# Patient Record
Sex: Male | Born: 1965 | Marital: Married | Smoking: Never smoker
Health system: Southern US, Community
[De-identification: ages and names within clinical notes are randomized; demographics above are authoritative.]

## PROBLEM LIST (undated history)

## (undated) DIAGNOSIS — A048 Other specified bacterial intestinal infections: Secondary | ICD-10-CM

## (undated) DIAGNOSIS — D473 Essential (hemorrhagic) thrombocythemia: Secondary | ICD-10-CM

## (undated) DIAGNOSIS — K7460 Unspecified cirrhosis of liver: Secondary | ICD-10-CM

## (undated) DIAGNOSIS — K7400 Hepatic fibrosis, unspecified: Secondary | ICD-10-CM

## (undated) DIAGNOSIS — B1910 Unspecified viral hepatitis B without hepatic coma: Secondary | ICD-10-CM

## (undated) DIAGNOSIS — D75839 Thrombocytosis, unspecified: Secondary | ICD-10-CM

## (undated) HISTORY — DX: Hepatic fibrosis: K74.0

## (undated) HISTORY — DX: Unspecified cirrhosis of liver: K74.60

## (undated) HISTORY — DX: Thrombocytosis, unspecified: D75.839

## (undated) HISTORY — DX: Other specified bacterial intestinal infections: A04.8

## (undated) HISTORY — DX: Unspecified viral hepatitis B without hepatic coma: B19.10

## (undated) HISTORY — DX: Hepatic fibrosis, unspecified: K74.00

## (undated) HISTORY — PX: NO PAST SURGERIES: SHX2092

## (undated) HISTORY — DX: Essential (hemorrhagic) thrombocythemia: D47.3

---

## 2006-06-08 ENCOUNTER — Ambulatory Visit: Payer: Self-pay | Admitting: Hematology & Oncology

## 2006-07-03 LAB — CBC WITH DIFFERENTIAL/PLATELET
Basophils Absolute: 0 10*3/uL (ref 0.0–0.1)
EOS%: 1.1 % (ref 0.0–7.0)
Eosinophils Absolute: 0.1 10*3/uL (ref 0.0–0.5)
HCT: 44.2 % (ref 38.7–49.9)
HGB: 14.4 g/dL (ref 13.0–17.1)
MCH: 25.5 pg — ABNORMAL LOW (ref 28.0–33.4)
MONO#: 0.5 10*3/uL (ref 0.1–0.9)
NEUT#: 4 10*3/uL (ref 1.5–6.5)
NEUT%: 57.9 % (ref 40.0–75.0)
RDW: 13 % (ref 11.2–14.6)
lymph#: 2.3 10*3/uL (ref 0.9–3.3)

## 2006-07-10 LAB — SPEP & IFE WITH QIG
Alpha-2-Globulin: 8.8 % (ref 7.1–11.8)
Beta 2: 6.1 % (ref 3.2–6.5)
Beta Globulin: 5.5 % (ref 4.7–7.2)
IgA: 256 mg/dL (ref 68–378)
IgG (Immunoglobin G), Serum: 2960 mg/dL — ABNORMAL HIGH (ref 694–1618)
Total Protein, Serum Electrophoresis: 8.7 g/dL — ABNORMAL HIGH (ref 6.0–8.3)

## 2006-07-10 LAB — COMPREHENSIVE METABOLIC PANEL
Albumin: 4.3 g/dL (ref 3.5–5.2)
BUN: 17 mg/dL (ref 6–23)
CO2: 26 mEq/L (ref 19–32)
Calcium: 9.2 mg/dL (ref 8.4–10.5)
Chloride: 102 mEq/L (ref 96–112)
Glucose, Bld: 83 mg/dL (ref 70–99)
Potassium: 4 mEq/L (ref 3.5–5.3)
Sodium: 142 mEq/L (ref 135–145)
Total Protein: 8.7 g/dL — ABNORMAL HIGH (ref 6.0–8.3)

## 2006-07-10 LAB — BETA 2 MICROGLOBULIN, SERUM: Beta-2 Microglobulin: 2.24 mg/L — ABNORMAL HIGH (ref 1.01–1.73)

## 2006-10-26 ENCOUNTER — Ambulatory Visit: Payer: Self-pay | Admitting: Hematology & Oncology

## 2006-10-30 LAB — CBC WITH DIFFERENTIAL/PLATELET
Basophils Absolute: 0 10*3/uL (ref 0.0–0.1)
EOS%: 0.8 % (ref 0.0–7.0)
Eosinophils Absolute: 0.1 10*3/uL (ref 0.0–0.5)
LYMPH%: 33.9 % (ref 14.0–48.0)
MCH: 25.8 pg — ABNORMAL LOW (ref 28.0–33.4)
MCV: 79.1 fL — ABNORMAL LOW (ref 81.6–98.0)
MONO%: 7.4 % (ref 0.0–13.0)
NEUT#: 4.1 10*3/uL (ref 1.5–6.5)
Platelets: 139 10*3/uL — ABNORMAL LOW (ref 145–400)
RBC: 5.39 10*6/uL (ref 4.20–5.71)
RDW: 13.5 % (ref 11.2–14.6)

## 2006-11-01 LAB — COMPREHENSIVE METABOLIC PANEL
ALT: 34 U/L (ref 0–53)
AST: 60 U/L — ABNORMAL HIGH (ref 0–37)
Albumin: 3.9 g/dL (ref 3.5–5.2)
Alkaline Phosphatase: 111 U/L (ref 39–117)
BUN: 17 mg/dL (ref 6–23)
CO2: 23 mEq/L (ref 19–32)
Calcium: 9.1 mg/dL (ref 8.4–10.5)
Chloride: 103 mEq/L (ref 96–112)
Creatinine, Ser: 1.15 mg/dL (ref 0.40–1.50)
Glucose, Bld: 116 mg/dL — ABNORMAL HIGH (ref 70–99)
Potassium: 4 mEq/L (ref 3.5–5.3)
Sodium: 137 mEq/L (ref 135–145)
Total Bilirubin: 0.7 mg/dL (ref 0.3–1.2)
Total Protein: 8.8 g/dL — ABNORMAL HIGH (ref 6.0–8.3)

## 2006-11-01 LAB — IGG, IGA, IGM
IgA: 210 mg/dL (ref 68–378)
IgG (Immunoglobin G), Serum: 3480 mg/dL — ABNORMAL HIGH (ref 694–1618)
IgM, Serum: 78 mg/dL (ref 60–263)

## 2006-11-01 LAB — PROTEIN ELECTROPHORESIS, SERUM
Albumin ELP: 43.2 % — ABNORMAL LOW (ref 55.8–66.1)
Alpha-1-Globulin: 3.3 % (ref 2.9–4.9)
Alpha-2-Globulin: 7.4 % (ref 7.1–11.8)
Beta 2: 5.9 % (ref 3.2–6.5)
Beta Globulin: 5.2 % (ref 4.7–7.2)
Gamma Globulin: 35 % — ABNORMAL HIGH (ref 11.1–18.8)
Total Protein, Serum Electrophoresis: 8.8 g/dL — ABNORMAL HIGH (ref 6.0–8.3)

## 2010-04-20 ENCOUNTER — Encounter: Admission: RE | Admit: 2010-04-20 | Discharge: 2010-04-20 | Payer: Self-pay | Admitting: Family Medicine

## 2011-05-07 ENCOUNTER — Ambulatory Visit (INDEPENDENT_AMBULATORY_CARE_PROVIDER_SITE_OTHER): Payer: 59

## 2011-05-07 DIAGNOSIS — Z0000 Encounter for general adult medical examination without abnormal findings: Secondary | ICD-10-CM

## 2011-05-07 DIAGNOSIS — Z23 Encounter for immunization: Secondary | ICD-10-CM

## 2011-05-07 DIAGNOSIS — F524 Premature ejaculation: Secondary | ICD-10-CM

## 2011-06-27 ENCOUNTER — Ambulatory Visit (INDEPENDENT_AMBULATORY_CARE_PROVIDER_SITE_OTHER): Payer: 59

## 2011-06-27 DIAGNOSIS — M79609 Pain in unspecified limb: Secondary | ICD-10-CM

## 2011-06-27 DIAGNOSIS — M25559 Pain in unspecified hip: Secondary | ICD-10-CM

## 2011-07-02 ENCOUNTER — Ambulatory Visit (INDEPENDENT_AMBULATORY_CARE_PROVIDER_SITE_OTHER): Payer: 59 | Admitting: Family Medicine

## 2011-07-02 DIAGNOSIS — IMO0001 Reserved for inherently not codable concepts without codable children: Secondary | ICD-10-CM

## 2011-07-02 DIAGNOSIS — M62838 Other muscle spasm: Secondary | ICD-10-CM

## 2011-07-02 DIAGNOSIS — M79606 Pain in leg, unspecified: Secondary | ICD-10-CM

## 2011-07-02 MED ORDER — METHOCARBAMOL 750 MG PO TABS: ORAL_TABLET | ORAL | Status: DC

## 2011-07-02 NOTE — Progress Notes (Signed)
  Subjective:    Patient ID: Arthur Wright, male    DOB: [birth year], 46 y.o.   MRN: [id number]  HPI patient is here for a recheck of that thigh pain. It continues to hurt quite a lot from the right lower lateral hip area down to the knee, just down the quadriceps area. Once again we do not know of any specific injury. However if the patient walks for a while it starts just spasming up. 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 He is scheduled to work some kind of a more stationary job tomorrow, then has Monday and Tuesday off. He thinks he might be able to get light duty at work is ordered.   Review of Systems no related issue     Objective:   Physical Exam pain distribution as described above he is not tender in his low back has good range of motion of the back he is a little tender in the right lateral hip and down his anterior thigh. He has been sitting here relaxing and it is not as tight right now since he has not been walking a distance. There is no radiation of symptoms below the knee.        Assessment & Plan:  Muscular pain right thigh, and overuse cramping  We will try a muscle relaxer, light duty, and a physical therapy referral. He is to let me know about Thursday or Friday how he is doing, and if absolutely no better, back in so I can reassess and arrange for a referral to an orthopedist or sports medicine specialist.

## 2011-07-02 NOTE — Patient Instructions (Signed)
Was advised to take the muscle relaxants and we will put him on light duty at work. In addition we will make referral to a physical therapist.

## 2012-11-10 ENCOUNTER — Ambulatory Visit (INDEPENDENT_AMBULATORY_CARE_PROVIDER_SITE_OTHER): Payer: 59 | Admitting: Family Medicine

## 2012-11-10 VITALS — BP 125/77 | HR 65 | Temp 97.7°F | Resp 16 | Ht 73.5 in | Wt 313.0 lb

## 2012-11-10 DIAGNOSIS — Z789 Other specified health status: Secondary | ICD-10-CM

## 2012-11-10 DIAGNOSIS — Z0000 Encounter for general adult medical examination without abnormal findings: Secondary | ICD-10-CM

## 2012-11-10 LAB — POCT CBC
Lymph, poc: 2.8 (ref 0.6–3.4)
MCH, POC: 25.3 pg — AB (ref 27–31.2)
MCHC: 30 g/dL — AB (ref 31.8–35.4)
MCV: 84.3 fL (ref 80–97)
MID (cbc): 0.5 (ref 0–0.9)
POC LYMPH PERCENT: 41.2 %L (ref 10–50)
Platelet Count, POC: 148 10*3/uL (ref 142–424)
RBC: 5.37 M/uL (ref 4.69–6.13)
RDW, POC: 13.3 %
WBC: 6.9 10*3/uL (ref 4.6–10.2)

## 2012-11-10 LAB — COMPREHENSIVE METABOLIC PANEL
Albumin: 4.2 g/dL (ref 3.5–5.2)
Alkaline Phosphatase: 84 U/L (ref 39–117)
BUN: 14 mg/dL (ref 6–23)
Creat: 1.07 mg/dL (ref 0.50–1.35)
Glucose, Bld: 84 mg/dL (ref 70–99)
Potassium: 4.4 mEq/L (ref 3.5–5.3)

## 2012-11-10 LAB — LIPID PANEL
HDL: 47 mg/dL (ref >39)
LDL Cholesterol: 80 mg/dL (ref 0–99)
Triglycerides: 74 mg/dL (ref <150)

## 2012-11-10 MED ORDER — ONDANSETRON 4 MG PO TBDP: ORAL_TABLET | ORAL | Status: DC

## 2012-11-10 MED ORDER — DOXYCYCLINE HYCLATE 100 MG PO TABS: ORAL_TABLET | ORAL | Status: DC

## 2012-11-10 MED ORDER — CIPROFLOXACIN HCL 500 MG PO TABS: ORAL_TABLET | ORAL | Status: DC

## 2012-11-10 NOTE — Progress Notes (Signed)
Annual physical examination:  History: 47 year old man here for physical examination. He is getting ready to go to [place] to visit his mother. He wants malaria prophylaxis. The other major concerns.  Past medical history: General he is been healthy. No major operations. No major medical illnesses. No surgeries. Not be allergic to any medications. Not on any regular medications.  Family history: Father is deceased from bone cancer. Mother is living in [place] still. No major familial disease.  Social history: Married, works doing Naval architect work Chief Executive Officer. Does not smoke, occasionally drinks, substances. Has 4 children from about 3-17. Attends [employer].  Review of systems: Constitutional: Unremarkable Respiratory: Unremarkable Cardiovascular: Unremarkable HEENT: Unremarkable GI: Unremarkable GU: Unremarkable Musculoskeletal: Unremarkable Dermatologic: Unremarkable Hematologic: Unremarkable Neurologic: Unremarkable Psychiatric: Unremarkable Endocrinologic: Unremarkable   Physical examination: Well-developed well-nourished moderately overweight African man in no acute distress. His TMs are normal. Eyes PERRLA. Fundi benign. Throat clear. Missing one upper tooth of the right. Neck supple without nodes thyromegaly. No carotid bruits. Chest clear to auscultation. Heart regular without murmurs gallops or arrhythmias. And soft without mass or tenderness. Normal male external genitalia. Throat is unremarkable. Skin unremarkable. He has a number of old scars.  Assessment: Normal physical examination Child medicine for upcoming trip to [place]  Plan: Doxycycline, Cipro, Zofran

## 2012-11-10 NOTE — Patient Instructions (Addendum)
Take the doxycycline one daily beginning the day before UA enter [place] and continuing for one month after return  In the event of traveler's diarrhea, take the ciprofloxacin one twice daily for 3 or 4 days  Use the Zofran (ondansetron) orally dissolvable tablets as needed for nausea and vomiting  Go on the Internet to [employer].gov and look up travel to [place] for other guidelines and precautions  Drink clean water

## 2013-04-04 ENCOUNTER — Other Ambulatory Visit: Payer: Self-pay

## 2014-03-14 ENCOUNTER — Other Ambulatory Visit: Payer: Self-pay

## 2014-11-22 ENCOUNTER — Ambulatory Visit (INDEPENDENT_AMBULATORY_CARE_PROVIDER_SITE_OTHER): Payer: 59 | Admitting: Family Medicine

## 2014-11-22 ENCOUNTER — Other Ambulatory Visit: Payer: Self-pay | Admitting: Family Medicine

## 2014-11-22 VITALS — BP 140/80 | HR 67 | Temp 98.2°F | Resp 18 | Ht 73.0 in | Wt 312.8 lb

## 2014-11-22 DIAGNOSIS — Z789 Other specified health status: Secondary | ICD-10-CM | POA: Diagnosis not present

## 2014-11-22 DIAGNOSIS — B181 Chronic viral hepatitis B without delta-agent: Secondary | ICD-10-CM | POA: Insufficient documentation

## 2014-11-22 DIAGNOSIS — B1910 Unspecified viral hepatitis B without hepatic coma: Secondary | ICD-10-CM

## 2014-11-22 DIAGNOSIS — Z0000 Encounter for general adult medical examination without abnormal findings: Secondary | ICD-10-CM

## 2014-11-22 DIAGNOSIS — Z8619 Personal history of other infectious and parasitic diseases: Secondary | ICD-10-CM | POA: Diagnosis not present

## 2014-11-22 DIAGNOSIS — E669 Obesity, unspecified: Secondary | ICD-10-CM | POA: Diagnosis not present

## 2014-11-22 DIAGNOSIS — B161 Acute hepatitis B with delta-agent without hepatic coma: Secondary | ICD-10-CM | POA: Diagnosis not present

## 2014-11-22 LAB — POCT URINALYSIS DIPSTICK
Bilirubin, UA: NEGATIVE
Blood, UA: NEGATIVE
Glucose, UA: NEGATIVE
Ketones, UA: NEGATIVE
Leukocytes, UA: NEGATIVE
Nitrite, UA: NEGATIVE
Spec Grav, UA: 1.02
Urobilinogen, UA: 1
pH, UA: 7

## 2014-11-22 LAB — POCT CBC
Granulocyte percent: 51.9 % (ref 37–80)
HCT, POC: 46.1 % (ref 43.5–53.7)
Hemoglobin: 13.9 g/dL — AB (ref 14.1–18.1)
Lymph, poc: 2.3 (ref 0.6–3.4)
MCH, POC: 24.2 pg — AB (ref 27–31.2)
MCHC: 30.1 g/dL — AB (ref 31.8–35.4)
MCV: 80.2 fL (ref 80–97)
MID (cbc): 0.6 (ref 0–0.9)
MPV: 9.1 fL (ref 0–99.8)
POC Granulocyte: 3.2 (ref 2–6.9)
POC LYMPH PERCENT: 38.5 % (ref 10–50)
POC MID %: 9.6 % (ref 0–12)
Platelet Count, POC: 137 K/uL — AB (ref 142–424)
RBC: 5.75 M/uL (ref 4.69–6.13)
RDW, POC: 14.1 %
WBC: 6.1 K/uL (ref 4.6–10.2)

## 2014-11-22 LAB — LIPID PANEL
Cholesterol: 144 mg/dL (ref 0–200)
HDL: 47 mg/dL
LDL Cholesterol: 80 mg/dL (ref 0–99)
Total CHOL/HDL Ratio: 3.1 ratio
Triglycerides: 84 mg/dL
VLDL: 17 mg/dL (ref 0–40)

## 2014-11-22 MED ORDER — DOXYCYCLINE HYCLATE 100 MG PO CAPS: 100.0000 mg | ORAL_CAPSULE | Freq: Every day | ORAL | Status: DC

## 2014-11-22 NOTE — Progress Notes (Signed)
Patient ID: Arthur Wright, male   DOB: 1965-08-29, 49 y.o.   MRN: [id number]   This chart was scribed for Arthur Haber, MD by [facility], medical scribe at [facility].The patient was seen in exam room 09 and the patient's care was started at 10:34 AM.  Patient ID: Arthur Wright MRN: [id number], DOB: [birth year], 49 y.o. Date of Encounter: 11/22/2014  Primary Physician: No primary care provider on file.  Chief Complaint:  Chief Complaint  Patient presents with   Annual Exam   HPI:  Arthur Wright is a 49 y.o. male who presents to [facility] here for an annual physical exam.   Married, 4 kids. He does not take any medications no surgeries. Family history of liver cancer. Both brothers and his father passed away due to liver cancer. He did have hepatitis B. He will be going to [place] on Monday and will be there for 45 days, he needs a prescription for doxycycline. He does some exercise, mostly ab workouts.  He is originally from [place]. 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  No past medical history on file.   Home Meds: Prior to Admission medications   Medication Sig Start Date End Date Taking? Authorizing Provider  ciprofloxacin (CIPRO) 500 MG tablet Take one twice daily beginning immediately after onset of diarrhea, continuing for 3 or 4 days. Patient not taking: Reported on 11/22/2014 11/10/12   Posey Boyer, MD  doxycycline (VIBRA-TABS) 100 MG tablet Take one daily starting the days you are going to [place] and continuing until 30 days after return Patient not taking: Reported on 11/22/2014 11/10/12   Posey Boyer, MD  doxycycline (VIBRAMYCIN) 100 MG capsule Take 100 mg by mouth daily.    Historical Provider, MD  methocarbamol (ROBAXIN) 750 MG tablet Use 2 tablets 3 time daily Patient not taking: Reported on 11/22/2014 07/02/11   Posey Boyer, MD  nabumetone (RELAFEN) 750 MG tablet Take 750 mg by mouth 2 (two) times daily as needed.     Historical Provider, MD  ondansetron (ZOFRAN ODT) 4 MG disintegrating tablet Dissolve one in mouth every 6 hours as needed for nausea and vomiting Patient not taking: Reported on 11/22/2014 11/10/12   Posey Boyer, MD  predniSONE (DELTASONE) 20 MG tablet Take 20 mg by mouth daily. As directed    Historical Provider, MD    Allergies: No Known Allergies  History   Social History   Marital Status: Single    Spouse Name: N/A   Number of Children: N/A   Years of Education: N/A   Occupational History   Not on file.   Social History Main Topics   Smoking status: Never Smoker    Smokeless tobacco: Not on file   Alcohol Use: 0.5 oz/week    1 drink(s) per week     Comment: every 2-4 weeks; occasionally   Drug Use: No   Sexual Activity: Not on file   Other Topics Concern   Not on file   Social History Narrative    Review of Systems: Constitutional: negative for chills, fever, night sweats, weight changes, or fatigue  HEENT: negative for vision changes, hearing loss, congestion, rhinorrhea, ST, epistaxis, or sinus pressure Cardiovascular: negative for chest pain or palpitations Respiratory: negative for hemoptysis, wheezing, shortness of breath, or cough Abdominal: negative for abdominal pain, nausea, vomiting, diarrhea, or constipation Dermatological: negative for rash Neurologic: negative for headache, dizziness, or syncope All other systems reviewed and  are otherwise negative with the exception to those above and in the HPI.  Physical Exam: Blood pressure 140/80, pulse 67, temperature 98.2 F (36.8 C), temperature source Oral, resp. rate 18, height 6\' 1"  (1.854 m), weight 312 lb 12.8 oz (141.885 kg), SpO2 97 %., Body mass index is 41.28 kg/(m^2). General: Well developed, well nourished, in no acute distress. Head: Normocephalic, atraumatic, eyes without discharge, sclera non-icteric, nares are without discharge. Bilateral auditory canals clear, TM's are without  perforation, pearly grey and translucent with reflective cone of light bilaterally. Oral cavity moist, posterior pharynx without exudate, erythema, peritonsillar abscess, or post nasal drip.  Neck: Supple. No thyromegaly. Full ROM. No lymphadenopathy. Lungs: Clear bilaterally to auscultation without wheezes, rales, or rhonchi. Breathing is unlabored. Heart: RRR with S1 S2. No murmurs, rubs, or gallops appreciated. Abdomen: Soft, non-tender, non-distended with normoactive bowel sounds. No hepatomegaly. No rebound/guarding. No obvious abdominal masses. Msk:  Strength and tone normal for age. Extremities/Skin: Warm and dry. No clubbing or cyanosis. No edema. No rashes or suspicious lesions. Neuro: Alert and oriented X 3. Moves all extremities spontaneously. Gait is normal. CNII-XII grossly in tact. Psych:  Responds to questions appropriately with a normal affect.   Labs: Results for orders placed or performed in visit on 11/10/12  Comprehensive metabolic panel  Result Value Ref Range   Sodium 134 (L) 135 - 145 mEq/L   Potassium 4.4 3.5 - 5.3 mEq/L   Chloride 102 96 - 112 mEq/L   CO2 28 19 - 32 mEq/L   Glucose, Bld 84 70 - 99 mg/dL   BUN 14 6 - 23 mg/dL   Creat 1.07 0.50 - 1.35 mg/dL   Total Bilirubin 0.5 0.3 - 1.2 mg/dL   Alkaline Phosphatase 84 39 - 117 U/L   AST 48 (H) 0 - 37 U/L   ALT 23 0 - 53 U/L   Total Protein 9.0 (H) 6.0 - 8.3 g/dL   Albumin 4.2 3.5 - 5.2 g/dL   Calcium 9.5 8.4 - 10.5 mg/dL  Lipid panel  Result Value Ref Range   Cholesterol 142 0 - 200 mg/dL   Triglycerides 74 <150 mg/dL   HDL 47 >39 mg/dL   Total CHOL/HDL Ratio 3.0 Ratio   VLDL 15 0 - 40 mg/dL   LDL Cholesterol 80 0 - 99 mg/dL  POCT CBC  Result Value Ref Range   WBC 6.9 4.6 - 10.2 K/uL   Lymph, poc 2.8 0.6 - 3.4   POC LYMPH PERCENT 41.2 10 - 50 %L   MID (cbc) 0.5 0 - 0.9   POC MID % 7.2 0 - 12 %M   POC Granulocyte 3.6 2 - 6.9   Granulocyte percent 51.6 37 - 80 %G   RBC 5.37 4.69 - 6.13 M/uL    Hemoglobin 13.6 (A) 14.1 - 18.1 g/dL   HCT, POC 45.3 43.5 - 53.7 %   MCV 84.3 80 - 97 fL   MCH, POC 25.3 (A) 27 - 31.2 pg   MCHC 30.0 (A) 31.8 - 35.4 g/dL   RDW, POC 13.3 %   Platelet Count, POC 148 142 - 424 K/uL   MPV 9.9 0 - 99.8 fL  POCT glycosylated hemoglobin (Hb A1C)  Result Value Ref Range   Hemoglobin A1C 5.0      ASSESSMENT AND PLAN:  49 y.o. year old male with annual exam  This chart was scribed in my presence and reviewed by me personally.    ICD-9-CM ICD-10-CM   1.  Annual physical exam V70.0 Z00.00 POCT CBC     POCT urinalysis dipstick     Lipid panel     COMPLETE METABOLIC PANEL WITH GFR  2. Viral hepatitis B with hepatitis delta, without hepatic coma, unspecified chronicity 070.31 B16.1 Hepatitis B surface antibody     Hepatitis B surface antigen     COMPLETE METABOLIC PANEL WITH GFR  3. History of foreign travel V49.89 Z39.9      Signed, Arthur Haber, MD  Signed, Arthur Haber, MD 11/22/2014 10:34 AM

## 2014-11-22 NOTE — Patient Instructions (Signed)

## 2014-11-23 LAB — HEPATITIS B SURFACE ANTIBODY, QUANTITATIVE: Hepatitis B-Post: 1 m[IU]/mL

## 2014-11-23 LAB — HEPATITIS B SURF AG CONFIRMATION: Hepatitis B Surf Ag Confirmation: POSITIVE — AB

## 2014-11-23 LAB — HEPATITIS B SURFACE ANTIGEN: Hepatitis B Surface Ag: POSITIVE — AB

## 2014-11-26 ENCOUNTER — Other Ambulatory Visit: Payer: Self-pay | Admitting: Family Medicine

## 2014-11-26 DIAGNOSIS — B1910 Unspecified viral hepatitis B without hepatic coma: Secondary | ICD-10-CM

## 2014-11-26 LAB — COMPREHENSIVE METABOLIC PANEL
ALT: 20 U/L (ref 0–53)
AST: 49 U/L — ABNORMAL HIGH (ref 0–37)
Albumin: 3.9 g/dL (ref 3.5–5.2)
Alkaline Phosphatase: 92 U/L (ref 39–117)
BUN: 12 mg/dL (ref 6–23)
CO2: 24 mEq/L (ref 19–32)
Calcium: 9.4 mg/dL (ref 8.4–10.5)
Chloride: 101 mEq/L (ref 96–112)
Creat: 1.05 mg/dL (ref 0.50–1.35)
Glucose, Bld: 79 mg/dL (ref 70–99)
Potassium: 4.2 mEq/L (ref 3.5–5.3)
Sodium: 138 mEq/L (ref 135–145)
Total Bilirubin: 0.4 mg/dL (ref 0.2–1.2)
Total Protein: 9.7 g/dL — ABNORMAL HIGH (ref 6.0–8.3)

## 2014-11-27 ENCOUNTER — Encounter: Payer: Self-pay | Admitting: Family Medicine

## 2014-11-27 NOTE — Progress Notes (Signed)
lmom to give [place] a call back plus i sent a copy of his labs

## 2014-12-05 ENCOUNTER — Telehealth: Payer: Self-pay

## 2014-12-05 NOTE — Telephone Encounter (Signed)
This patient phone is not working and the cell phone isn't ringing its off with no voice mail set up i thought this patient went to [place] maybe that's why his phone is off Doctor

## 2014-12-05 NOTE — Telephone Encounter (Signed)
-----   Message from Robyn Haber, MD sent at 11/27/2014  6:34 AM EDT ----- Let patient know that he has been referred to gastroenterology for the active hepatitis B.  (He may be out of the country in [place] the month of July) ----- Message -----    From: SYSTEM    Sent: 11/27/2014  12:04 AM      To: Robyn Haber, MD

## 2014-12-06 ENCOUNTER — Encounter: Payer: Self-pay | Admitting: Family Medicine

## 2014-12-12 NOTE — Telephone Encounter (Signed)
Pt called back about labs. Notified. He is out of town and will call for referral when he gets back

## 2015-01-28 ENCOUNTER — Telehealth: Payer: Self-pay

## 2015-01-28 DIAGNOSIS — B169 Acute hepatitis B without delta-agent and without hepatic coma: Secondary | ICD-10-CM

## 2015-01-28 NOTE — Telephone Encounter (Signed)
New referral placed.

## 2015-01-28 NOTE — Telephone Encounter (Signed)
Patient called and asked for a referral to a liver specialist.  We originally placed a referral at [facility], but the referrals coordinator there sent a message in that referral:  General 11/28/2014 10:21 AM SHIVES, DUSTIN T        Note   7/1 left message for call back - Needs to go to [place].        Please submit new referral for patient.  Thank you.

## 2015-01-29 ENCOUNTER — Telehealth: Payer: Self-pay

## 2015-01-29 NOTE — Telephone Encounter (Signed)
Patient called for referrals with  A phone number for [facility] care [phone]

## 2015-03-03 ENCOUNTER — Other Ambulatory Visit (HOSPITAL_COMMUNITY): Payer: Self-pay | Admitting: Nurse Practitioner

## 2015-03-03 DIAGNOSIS — B181 Chronic viral hepatitis B without delta-agent: Secondary | ICD-10-CM

## 2015-03-17 ENCOUNTER — Ambulatory Visit (HOSPITAL_COMMUNITY)
Admission: RE | Admit: 2015-03-17 | Discharge: 2015-03-17 | Disposition: A | Discharge status: Home / Self Care | Payer: 59 | Source: Ambulatory Visit | Attending: Nurse Practitioner | Admitting: Nurse Practitioner

## 2015-03-17 DIAGNOSIS — B181 Chronic viral hepatitis B without delta-agent: Secondary | ICD-10-CM | POA: Diagnosis not present

## 2015-06-09 ENCOUNTER — Other Ambulatory Visit: Payer: Self-pay | Admitting: Nurse Practitioner

## 2015-06-09 DIAGNOSIS — B181 Chronic viral hepatitis B without delta-agent: Secondary | ICD-10-CM

## 2015-09-08 ENCOUNTER — Ambulatory Visit
Admission: RE | Admit: 2015-09-08 | Discharge: 2015-09-08 | Disposition: A | Discharge status: Home / Self Care | Payer: 59 | Source: Ambulatory Visit | Attending: Nurse Practitioner | Admitting: Nurse Practitioner

## 2015-09-08 DIAGNOSIS — B181 Chronic viral hepatitis B without delta-agent: Secondary | ICD-10-CM

## 2015-12-22 ENCOUNTER — Ambulatory Visit (INDEPENDENT_AMBULATORY_CARE_PROVIDER_SITE_OTHER): Payer: 59 | Admitting: Urgent Care

## 2015-12-22 VITALS — BP 120/90 | HR 85 | Temp 98.2°F | Resp 16 | Ht 73.0 in | Wt 309.8 lb

## 2015-12-22 DIAGNOSIS — R21 Rash and other nonspecific skin eruption: Secondary | ICD-10-CM | POA: Diagnosis not present

## 2015-12-22 DIAGNOSIS — B169 Acute hepatitis B without delta-agent and without hepatic coma: Secondary | ICD-10-CM

## 2015-12-22 DIAGNOSIS — B1910 Unspecified viral hepatitis B without hepatic coma: Secondary | ICD-10-CM

## 2015-12-22 DIAGNOSIS — Z0000 Encounter for general adult medical examination without abnormal findings: Secondary | ICD-10-CM | POA: Diagnosis not present

## 2015-12-22 DIAGNOSIS — Z23 Encounter for immunization: Secondary | ICD-10-CM | POA: Diagnosis not present

## 2015-12-22 LAB — COMPREHENSIVE METABOLIC PANEL
ALBUMIN: 4 g/dL (ref 3.6–5.1)
ALK PHOS: 93 U/L (ref 40–115)
ALT: 33 U/L (ref 9–46)
AST: 69 U/L — AB (ref 10–40)
BILIRUBIN TOTAL: 0.5 mg/dL (ref 0.2–1.2)
BUN: 12 mg/dL (ref 7–25)
CHLORIDE: 102 mmol/L (ref 98–110)
CO2: 25 mmol/L (ref 20–31)
CREATININE: 1.04 mg/dL (ref 0.60–1.35)
Calcium: 9 mg/dL (ref 8.6–10.3)
Glucose, Bld: 87 mg/dL (ref 65–99)
Potassium: 4.3 mmol/L (ref 3.5–5.3)
SODIUM: 135 mmol/L (ref 135–146)
TOTAL PROTEIN: 9.2 g/dL — AB (ref 6.1–8.1)

## 2015-12-22 LAB — CBC
HCT: 43.5 % (ref 38.5–50.0)
HEMOGLOBIN: 13.8 g/dL (ref 13.2–17.1)
MCH: 25 pg — AB (ref 27.0–33.0)
MCHC: 31.7 g/dL — ABNORMAL LOW (ref 32.0–36.0)
MCV: 78.7 fL — ABNORMAL LOW (ref 80.0–100.0)
Platelets: 145 10*3/uL (ref 140–400)
RBC: 5.53 MIL/uL (ref 4.20–5.80)
RDW: 13.5 % (ref 11.0–15.0)
WBC: 5.3 10*3/uL (ref 3.8–10.8)

## 2015-12-22 LAB — TSH: TSH: 0.92 m[IU]/L (ref 0.40–4.50)

## 2015-12-22 LAB — LIPID PANEL
Cholesterol: 136 mg/dL (ref 125–200)
HDL: 52 mg/dL (ref >=40)
LDL CALC: 68 mg/dL (ref <130)
Total CHOL/HDL Ratio: 2.6 Ratio (ref <=5.0)
Triglycerides: 79 mg/dL (ref <150)
VLDL: 16 mg/dL (ref <30)

## 2015-12-22 LAB — POCT SKIN KOH: Skin KOH, POC: POSITIVE

## 2015-12-22 MED ORDER — KETOCONAZOLE 2 % EX CREA: 1.0000 "application " | TOPICAL_CREAM | Freq: Every day | CUTANEOUS | 0 refills | Status: AC

## 2015-12-22 NOTE — Progress Notes (Signed)
MRN: [id number]  Subjective:   [name] is a 50 y.o. male presenting for annual physical exam.  Medical care team includes: PCP: Dr. Linna Darner who is now retired, Jaynee Eagles, PA-C will no be PCP. Vision: Wears glasses, has yearly eye exams. Dental: Gets cleanings yearly. Specialists: Managed by a Nurse Practitioner for viral hepatitis. Has follow up scheduled in Sept 2017. RUQ U/S was reassuring in 08/2015.    Patient is married, has 4 children, good relationships at home. Works as a Biochemist, clinical. Declines STI testing. Denies smoking cigarettes or drinking alcohol.   Arthur Wright has H/O type B viral hepatitis and Obesity on his problem list.  Arthur Wright currently has no medications in their medication list. He has No Known Allergies.  Arthur Wright  has a past medical history of Hepatitis B. Also denies past surgeries.  His family history includes Cancer in his father. Father passed from bone cancer, also had diabetes. Brother also had liver cancer, passed from this.  Immunizations: Needs TDAP updated.  Review of Systems  Constitutional: Negative for chills, diaphoresis, fever, malaise/fatigue and weight loss.  HENT: Negative for congestion, ear discharge, ear pain, hearing loss, nosebleeds, sore throat and tinnitus.   Eyes: Negative for blurred vision, double vision, photophobia, pain, discharge and redness.  Respiratory: Negative for cough, shortness of breath and wheezing.   Cardiovascular: Negative for chest pain, palpitations and leg swelling.  Gastrointestinal: Negative for abdominal pain, blood in stool, constipation, diarrhea, nausea and vomiting.  Genitourinary: Negative for dysuria, flank pain, frequency, hematuria and urgency.  Musculoskeletal: Negative for back pain, joint pain and myalgias.  Skin: Positive for rash (right scalp). Negative for itching.  Neurological: Negative for dizziness, tingling, seizures, loss of consciousness, weakness and headaches.    Endo/Heme/Allergies: Negative for polydipsia.  Psychiatric/Behavioral: Negative for depression, hallucinations, memory loss, substance abuse and suicidal ideas. The patient is not nervous/anxious and does not have insomnia.    Objective:   Vitals: BP 120/90   Pulse 85   Temp 98.2 F (36.8 C) (Oral)   Resp 16   Ht 6\' 1"  (1.854 m)   Wt (!) 309 lb 12.8 oz (140.5 kg)   SpO2 97%   BMI 40.87 kg/m    Visual Acuity Screening   Right eye Left eye Both eyes  Without correction:     With correction: 20/15 20/15 20/15     Physical Exam  Constitutional: He is oriented to person, place, and time. He appears well-developed and well-nourished.  HENT:  TM's intact bilaterally, no effusions or erythema. Nasal turbinates pink and moist, nasal passages patent. No sinus tenderness. Oropharynx clear, mucous membranes moist, dentition in good repair.  Eyes: Conjunctivae and EOM are normal. Pupils are equal, round, and reactive to light. Right eye exhibits no discharge. Left eye exhibits no discharge. No scleral icterus.  Neck: Normal range of motion. Neck supple. No thyromegaly present.  Cardiovascular: Normal rate, regular rhythm and intact distal pulses.  Exam reveals no gallop and no friction rub.   No murmur heard. Pulmonary/Chest: No stridor. No respiratory distress. He has no wheezes. He has no rales.  Abdominal: Soft. Bowel sounds are normal. He exhibits no distension and no mass. There is no tenderness.  Musculoskeletal: Normal range of motion. He exhibits no edema or tenderness.  Lymphadenopathy:    He has no cervical adenopathy.  Neurological: He is alert and oriented to person, place, and time. He has normal reflexes.  Skin: Skin is warm and dry. Rash (3 annular scaly  pruritic lesions over right temporal scalp and left upper lateral thigh all about ~1.5cm in size) noted. No erythema. No pallor.  Psychiatric: He has a normal mood and affect.   Results for orders placed or performed in  visit on 12/22/15 (from the past 24 hour(s))  POCT Skin KOH     Status: None   Collection Time: 12/22/15 12:32 PM  Result Value Ref Range   Skin KOH, POC Positive     Assessment and Plan :   1. Annual physical exam - Medically stable, labs pending. - Discussed healthy lifestyle, diet, exercise, preventative care, vaccinations, and addressed patient's concerns.    2. Hepatitis B virus infection, unspecified chronicity - Hepatitis B surface antigen pending, continue f/u with GI.  3. Rash and nonspecific skin eruption - Start ketoconazole for 2 weeks. Call if no improvement.  4. Need for Tdap vaccination - Tdap vaccine greater than or equal to 7yo IM   Jaynee Eagles, PA-C [facility] [phone] 12/22/2015  11:48 AM

## 2015-12-22 NOTE — Patient Instructions (Addendum)

## 2015-12-23 LAB — HEPATITIS B SURFACE ANTIGEN: HEP B S AG: POSITIVE — AB

## 2015-12-23 LAB — HEPATITIS B SURF AG CONFIRMATION: Hepatitis B Surf Ag Confirmation: POSITIVE — AB

## 2016-03-08 ENCOUNTER — Other Ambulatory Visit (HOSPITAL_COMMUNITY): Payer: Self-pay | Admitting: Nurse Practitioner

## 2016-03-08 DIAGNOSIS — B181 Chronic viral hepatitis B without delta-agent: Secondary | ICD-10-CM

## 2016-03-08 DIAGNOSIS — R748 Abnormal levels of other serum enzymes: Secondary | ICD-10-CM

## 2016-03-09 ENCOUNTER — Other Ambulatory Visit: Payer: Self-pay | Admitting: Nurse Practitioner

## 2016-03-09 DIAGNOSIS — B181 Chronic viral hepatitis B without delta-agent: Secondary | ICD-10-CM

## 2016-03-21 ENCOUNTER — Ambulatory Visit
Admission: RE | Admit: 2016-03-21 | Discharge: 2016-03-21 | Disposition: A | Discharge status: Home / Self Care | Payer: 59 | Source: Ambulatory Visit | Attending: Nurse Practitioner | Admitting: Nurse Practitioner

## 2016-03-21 DIAGNOSIS — B181 Chronic viral hepatitis B without delta-agent: Secondary | ICD-10-CM

## 2016-03-28 ENCOUNTER — Other Ambulatory Visit: Payer: Self-pay | Admitting: Radiology

## 2016-03-29 ENCOUNTER — Encounter (HOSPITAL_COMMUNITY): Payer: Self-pay

## 2016-03-29 ENCOUNTER — Ambulatory Visit (HOSPITAL_COMMUNITY)
Admission: RE | Admit: 2016-03-29 | Discharge: 2016-03-29 | Disposition: A | Discharge status: Home / Self Care | Payer: 59 | Source: Ambulatory Visit | Attending: Nurse Practitioner | Admitting: Nurse Practitioner

## 2016-03-29 DIAGNOSIS — R945 Abnormal results of liver function studies: Secondary | ICD-10-CM | POA: Diagnosis present

## 2016-03-29 DIAGNOSIS — B1910 Unspecified viral hepatitis B without hepatic coma: Secondary | ICD-10-CM | POA: Insufficient documentation

## 2016-03-29 DIAGNOSIS — K740 Hepatic fibrosis: Secondary | ICD-10-CM | POA: Diagnosis not present

## 2016-03-29 DIAGNOSIS — R748 Abnormal levels of other serum enzymes: Secondary | ICD-10-CM

## 2016-03-29 DIAGNOSIS — Z800 Family history of malignant neoplasm of digestive organs: Secondary | ICD-10-CM | POA: Insufficient documentation

## 2016-03-29 DIAGNOSIS — Z808 Family history of malignant neoplasm of other organs or systems: Secondary | ICD-10-CM | POA: Diagnosis not present

## 2016-03-29 DIAGNOSIS — B181 Chronic viral hepatitis B without delta-agent: Secondary | ICD-10-CM

## 2016-03-29 LAB — PROTIME-INR
INR: 1.07
PROTHROMBIN TIME: 13.9 s (ref 11.4–15.2)

## 2016-03-29 LAB — CBC
HEMATOCRIT: 43.7 % (ref 39.0–52.0)
Hemoglobin: 14 g/dL (ref 13.0–17.0)
MCH: 25.3 pg — AB (ref 26.0–34.0)
MCHC: 32 g/dL (ref 30.0–36.0)
MCV: 78.9 fL (ref 78.0–100.0)
Platelets: 151 10*3/uL (ref 150–400)
RBC: 5.54 MIL/uL (ref 4.22–5.81)
RDW: 13.3 % (ref 11.5–15.5)
WBC: 5.6 10*3/uL (ref 4.0–10.5)

## 2016-03-29 LAB — APTT: APTT: 37 s — AB (ref 24–36)

## 2016-03-29 MED ORDER — MIDAZOLAM HCL 2 MG/2ML IJ SOLN
INTRAMUSCULAR | Status: AC | PRN
  Administered 2016-03-29: 2 mg via INTRAVENOUS

## 2016-03-29 MED ORDER — GELATIN ABSORBABLE 12-7 MM EX MISC
CUTANEOUS | Status: AC
  Filled 2016-03-29: qty 1

## 2016-03-29 MED ORDER — FENTANYL CITRATE (PF) 100 MCG/2ML IJ SOLN
INTRAMUSCULAR | Status: AC | PRN
  Administered 2016-03-29 (×2): 50 ug via INTRAVENOUS

## 2016-03-29 MED ORDER — LIDOCAINE HCL 1 % IJ SOLN
INTRAMUSCULAR | Status: AC
  Filled 2016-03-29: qty 20

## 2016-03-29 MED ORDER — SODIUM CHLORIDE 0.9 % IV SOLN
INTRAVENOUS | Status: AC | PRN
  Administered 2016-03-29: 30 mL/h via INTRAVENOUS

## 2016-03-29 MED ORDER — SODIUM CHLORIDE 0.9 % IV SOLN: INTRAVENOUS | Status: DC

## 2016-03-29 MED ORDER — MIDAZOLAM HCL 2 MG/2ML IJ SOLN
INTRAMUSCULAR | Status: AC
  Filled 2016-03-29: qty 4

## 2016-03-29 MED ORDER — FENTANYL CITRATE (PF) 100 MCG/2ML IJ SOLN
INTRAMUSCULAR | Status: AC
  Filled 2016-03-29: qty 4

## 2016-03-29 NOTE — Discharge Instructions (Signed)

## 2016-03-29 NOTE — Sedation Documentation (Signed)
Patient is resting comfortably. 

## 2016-03-29 NOTE — H&P (Signed)
Chief Complaint: Patient was seen in consultation today for liver core biopsy at the request of Drazek,Dawn  Referring Physician(s): Drazek,Dawn  Supervising Physician: Aletta Edouard  Patient Status: [facility] - Out-pt  History of Present Illness: Arthur Wright is a 50 y.o. male   Known Hep B Follows with [facility] NP Noted new elevation in liver functions Now scheduled for random liver core biopsy  [place] 03/21/16: IMPRESSION: Similar findings suggestive of mild cirrhotic change. No discrete hepatic lesions. No ascites. Further evaluation with abdominal MRI could be performed as indicated  Past Medical History:  Diagnosis Date  . Hepatitis B     History reviewed. No pertinent surgical history.  Allergies: Review of patient's allergies indicates no known allergies.  Medications: Prior to Admission medications   Not on File     Family History  Problem Relation Age of Onset  . Cancer Father     Social History   Social History  . Marital status: Single    Spouse name: N/A  . Number of children: N/A  . Years of education: N/A   Social History Main Topics  . Smoking status: Never Smoker  . Smokeless tobacco: None  . Alcohol use 0.5 oz/week    1 drink(s) per week     Comment: every 2-4 weeks; occasionally  . Drug use: No  . Sexual activity: Not Asked   Other Topics Concern  . None   Social History Narrative  . None     Review of Systems: A 12 point ROS discussed and pertinent positives are indicated in the HPI above.  All other systems are negative.  Review of Systems  Constitutional: Negative for activity change, appetite change and fatigue.  Respiratory: Negative for shortness of breath.   Cardiovascular: Negative for chest pain.  Gastrointestinal: Negative for abdominal pain.  Neurological: Negative for weakness.  Psychiatric/Behavioral: Negative for behavioral problems and confusion.    Vital Signs: BP (!) 142/85   Pulse 62   Temp  97.9 F (36.6 C) (Oral)   Resp 20   Ht 6' 1.5" (1.867 m)   Wt (!) 309 lb (140.2 kg)   SpO2 98%   BMI 40.21 kg/m   Physical Exam  Constitutional: He is oriented to person, place, and time. He appears well-nourished.  Cardiovascular: Normal rate, regular rhythm and normal heart sounds.   Pulmonary/Chest: Effort normal and breath sounds normal. He has no wheezes.  Abdominal: Soft. Bowel sounds are normal. There is no tenderness.  Musculoskeletal: Normal range of motion.  Neurological: He is alert and oriented to person, place, and time.  Skin: Skin is warm and dry.  Psychiatric: He has a normal mood and affect. His behavior is normal. Judgment and thought content normal.  Nursing note and vitals reviewed.   Mallampati Score:  MD Evaluation Airway: WNL Heart: WNL Abdomen: WNL Chest/ Lungs: WNL ASA  Classification: 2 Mallampati/Airway Score: Two  Imaging: US Abdomen Limited Ruq  Result Date: 03/21/2016 CLINICAL DATA:  Chronic hepatitis-C. EXAM: US ABDOMEN LIMITED - RIGHT UPPER QUADRANT COMPARISON:  Right upper quadrant abdominal ultrasound - 09/08/2015; CT abdomen and pelvis - 04/20/2010 FINDINGS: Gallbladder: The gallbladder is normal in appearance. No gallbladder wall thickening or pericholecystic fluid. No radiopaque gallstones or sludge. Negative sonographic Murphy's sign. Common bile duct: Diameter: Normal in size measuring 4 mm in diameter Liver: Re- demonstrated diffuse slightly coarsened echogenicity of the hepatic parenchyma with apparent mild nodularity of the hepatic or (representative image 12). No discrete hepatic lesions. No  intrahepatic biliary duct dilatation. No ascites. IMPRESSION: Similar findings suggestive of mild cirrhotic change. No discrete hepatic lesions. No ascites. Further evaluation with abdominal MRI could be performed as indicated. Electronically Signed   By: Sandi Mariscal M.D.   On: 03/21/2016 09:10    Labs:  CBC:  Recent Labs  12/22/15 1203  03/29/16 1133  WBC 5.3 5.6  HGB 13.8 14.0  HCT 43.5 43.7  PLT 145 151    COAGS:  Recent Labs  03/29/16 1133  INR 1.07  APTT 37*    BMP:  Recent Labs  12/22/15 1203  NA 135  K 4.3  CL 102  CO2 25  GLUCOSE 87  BUN 12  CALCIUM 9.0  CREATININE 1.04    LIVER FUNCTION TESTS:  Recent Labs  12/22/15 1203  BILITOT 0.5  AST 69*  ALT 33  ALKPHOS 93  PROT 9.2*  ALBUMIN 4.0    TUMOR MARKERS: No results for input(s): AFPTM, CEA, CA199, CHROMGRNA in the last 8760 hours.  Assessment and Plan:  Known Hep B New elevated LFTs Now for liver core biopsy Risks and Benefits discussed with the patient including, but not limited to bleeding, infection, damage to adjacent structures or low yield requiring additional tests. All of the patient's questions were answered, patient is agreeable to proceed. Consent signed and in chart.   Thank you for this interesting consult.  I greatly enjoyed meeting Arthur Wright and look forward to participating in their care.  A copy of this report was sent to the requesting provider on this date.  Electronically Signed: Ayonna Speranza A 03/29/2016, 12:17 PM   I spent a total of  30 Minutes   in face to face in clinical consultation, greater than 50% of which was counseling/coordinating care for liver core bx

## 2016-03-29 NOTE — Procedures (Signed)
Interventional Radiology Procedure Note  Procedure:  US guided core biopsy of liver  Complications: None  Estimated Blood Loss: < 10 mL  18 G core biopsy x 2 via 17 G needle.  Gelfoam pledgets advanced on completion.  Venetia Night. Kathlene Cote, M.D Pager:  [phone]

## 2016-03-29 NOTE — Sedation Documentation (Signed)
o2 2l/[place] started

## 2016-03-29 NOTE — Sedation Documentation (Signed)
Family updated as to patient's status.  Family at side awaiting transporters to [facility]

## 2016-05-12 ENCOUNTER — Encounter: Payer: Self-pay | Admitting: Physician Assistant

## 2016-09-14 ENCOUNTER — Other Ambulatory Visit: Payer: Self-pay | Admitting: Nurse Practitioner

## 2016-09-14 DIAGNOSIS — I10 Essential (primary) hypertension: Secondary | ICD-10-CM

## 2016-09-14 DIAGNOSIS — K7469 Other cirrhosis of liver: Secondary | ICD-10-CM

## 2016-09-15 ENCOUNTER — Encounter: Payer: Self-pay | Admitting: Gastroenterology

## 2016-09-26 ENCOUNTER — Ambulatory Visit
Admission: RE | Admit: 2016-09-26 | Discharge: 2016-09-26 | Disposition: A | Discharge status: Home / Self Care | Payer: 59 | Source: Ambulatory Visit | Attending: Nurse Practitioner | Admitting: Nurse Practitioner

## 2016-09-26 DIAGNOSIS — K7460 Unspecified cirrhosis of liver: Secondary | ICD-10-CM | POA: Diagnosis not present

## 2016-09-26 DIAGNOSIS — I10 Essential (primary) hypertension: Secondary | ICD-10-CM

## 2016-09-26 DIAGNOSIS — K7469 Other cirrhosis of liver: Secondary | ICD-10-CM

## 2016-10-18 ENCOUNTER — Encounter: Payer: Self-pay | Admitting: Gastroenterology

## 2016-10-18 ENCOUNTER — Ambulatory Visit (INDEPENDENT_AMBULATORY_CARE_PROVIDER_SITE_OTHER): Payer: 59 | Admitting: Gastroenterology

## 2016-10-18 VITALS — BP 112/78 | HR 74 | Ht 74.0 in | Wt 307.6 lb

## 2016-10-18 DIAGNOSIS — K7469 Other cirrhosis of liver: Secondary | ICD-10-CM | POA: Diagnosis not present

## 2016-10-18 DIAGNOSIS — B181 Chronic viral hepatitis B without delta-agent: Secondary | ICD-10-CM | POA: Diagnosis not present

## 2016-10-18 NOTE — Progress Notes (Signed)
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 He has been on antiviral therapy since earlier this year and his last known platelet count is 150,000 with a normal INR. He has not had any previous upper GI bleeding, encephalopathy, or problems with volume overload. He denies passing black or bloody stool, hematemesis, abdominal pain or altered bowel habits. He works full time driving a Forensic scientist.    ROS:  Review of Systems  Constitutional: Negative for appetite change and unexpected weight change.  HENT: Negative for mouth sores and voice change.   Eyes: Negative for pain and redness.  Respiratory: Negative for cough and shortness of breath.   Cardiovascular: Negative for chest pain and palpitations.  Genitourinary: Negative for dysuria and hematuria.  Musculoskeletal: Negative for arthralgias and myalgias.  Skin: Negative for pallor and rash.  Neurological: Negative for weakness and headaches.  Hematological: Negative for adenopathy.     Past Medical History: Past Medical History:  Diagnosis Date  . Cirrhosis ([place])   . Hepatitis B   . Liver fibrosis   . Thrombocythemia [facility])      Past Surgical History: History reviewed. No pertinent surgical history.  No prior abdominal surgery   Family History: Family History  Problem Relation Age of Onset  . Hepatitis B Father   . Hypertension Father   . Hepatitis B Brother   . Liver cancer Brother     Social History: Social History   Social History  . Marital status: Single    Spouse name: N/A  . Number of children: N/A  . Years of education: N/A     Social History Main Topics  . Smoking status: Never Smoker  . Smokeless tobacco: Never Used  . Alcohol use 0.5 oz/week    1 Standard drinks or equivalent per week     Comment: every 2-4 weeks; occasionally  . Drug use: No  . Sexual activity: Not Asked   Other Topics Concern  . None   Social History Narrative  . None   He is originally from [place] in [place] and has lived in the [place] 16 years.  Allergies: No Known Allergies  Outpatient Meds: Current Outpatient Prescriptions  Medication Sig Dispense Refill  . entecavir (BARACLUDE) 0.5 MG tablet Take 0.5 mg by mouth daily.     No current facility-administered medications for this visit.       ___________________________________________________________________ Objective   Exam:  BP 112/78   Pulse 74   Ht 6\' 2"  (1.88 m)   Wt (!) 307 lb 9.6 oz (139.5 kg)   BMI 39.49 kg/m    General: this is a(n)Obese and otherwise well-appearing man  Eyes: sclera anicteric, no redness  ENT: oral mucosa moist without lesions, no cervical or supraclavicular lymphadenopathy, good dentition  CV: RRR without murmur, S1/S2, no JVD, no peripheral edema  Resp: clear to auscultation bilaterally, normal RR and effort noted  GI: soft,  No tenderness, with active bowel sounds. No guarding or palpable organomegaly noted.  Skin; warm and dry, no rash or jaundice noted  Neuro: awake, alert and oriented x 3. Normal gross motor function and fluent speech  Labs:  CBC Latest Ref Rng &  Units 03/29/2016 12/22/2015 11/22/2014  WBC 4.0 - 10.5 K/uL 5.6 5.3 6.1  Hemoglobin 13.0 - 17.0 g/dL 14.0 13.8 13.9(A)  Hematocrit 39.0 - 52.0 % 43.7 43.5 46.1  Platelets 150 - 400 K/uL 151 145 -   INR normal 1.07 in October 2017  Radiologic StudOctober 2017 right upper quadrant ultrasound showed a coarsened liver echotexture with no masses. Liver biopsy at that time confirmed cirrhosis.  Assessment: Encounter Diagnoses  Name Primary?  . Other cirrhosis  of liver ([place])   . Chronic hepatitis B ([place]) Yes    Compensated hepatic cirrhosis, in need of screening EGD. I had a lengthy discussion with him about the nature of esophageal varices and the need to screen for them and cirrhosis. He understands and is ready to proceed with EGD.    The benefits and risks of the planned procedure were described in detail with the patient or (when appropriate) their health care proxy.  Risks were outlined as including, but not limited to, bleeding, infection, perforation, adverse medication reaction leading to cardiac or pulmonary decompensation, or pancreatitis (if ERCP).  The limitation of incomplete mucosal visualization was also discussed.  No guarantees or warranties were given.  Thank you for the courtesy of this consult.  Please call me with any questions or concerns.  Nelida Meuse III  CC: Roosevelt Locks, NP

## 2016-10-18 NOTE — Patient Instructions (Signed)
If you are age 50 or older, your body mass index should be between 23-30. Your Body mass index is 39.49 kg/m. If this is out of the aforementioned range listed, please consider follow up with your Primary Care Provider.  If you are age 18 or younger, your body mass index should be between 19-25. Your Body mass index is 39.49 kg/m. If this is out of the aformentioned range listed, please consider follow up with your Primary Care Provider.   You have been scheduled for an endoscopy. Please follow written instructions given to you at your visit today. If you use inhalers (even only as needed), please bring them with you on the day of your procedure. Your physician has requested that you go to www.startemmi.com and enter the access code given to you at your visit today. This web site gives a general overview about your procedure. However, you should still follow specific instructions given to you by our office regarding your preparation for the procedure.  Thank you for choosing [facility]  Dr Wilfrid Lund III

## 2016-11-01 ENCOUNTER — Encounter: Payer: Self-pay | Admitting: Gastroenterology

## 2016-11-14 ENCOUNTER — Encounter: Payer: 59 | Admitting: Gastroenterology

## 2017-03-13 DIAGNOSIS — K7469 Other cirrhosis of liver: Secondary | ICD-10-CM | POA: Diagnosis not present

## 2017-03-14 ENCOUNTER — Other Ambulatory Visit: Payer: Self-pay | Admitting: Nurse Practitioner

## 2017-03-14 DIAGNOSIS — B181 Chronic viral hepatitis B without delta-agent: Secondary | ICD-10-CM

## 2017-03-14 DIAGNOSIS — K7469 Other cirrhosis of liver: Secondary | ICD-10-CM

## 2017-03-20 ENCOUNTER — Ambulatory Visit
Admission: RE | Admit: 2017-03-20 | Discharge: 2017-03-20 | Disposition: A | Discharge status: Home / Self Care | Payer: 59 | Source: Ambulatory Visit | Attending: Nurse Practitioner | Admitting: Nurse Practitioner

## 2017-03-20 DIAGNOSIS — B181 Chronic viral hepatitis B without delta-agent: Secondary | ICD-10-CM

## 2017-03-20 DIAGNOSIS — K7469 Other cirrhosis of liver: Secondary | ICD-10-CM

## 2017-09-04 DIAGNOSIS — K7469 Other cirrhosis of liver: Secondary | ICD-10-CM | POA: Diagnosis not present

## 2017-09-06 ENCOUNTER — Other Ambulatory Visit: Payer: Self-pay | Admitting: Nurse Practitioner

## 2017-09-06 DIAGNOSIS — B181 Chronic viral hepatitis B without delta-agent: Secondary | ICD-10-CM

## 2017-09-19 ENCOUNTER — Other Ambulatory Visit: Payer: 59

## 2017-09-25 ENCOUNTER — Ambulatory Visit
Admission: RE | Admit: 2017-09-25 | Discharge: 2017-09-25 | Disposition: A | Discharge status: Home / Self Care | Payer: 59 | Source: Ambulatory Visit | Attending: Nurse Practitioner | Admitting: Nurse Practitioner

## 2017-09-25 DIAGNOSIS — B181 Chronic viral hepatitis B without delta-agent: Secondary | ICD-10-CM

## 2017-09-25 IMAGING — US US ABDOMEN COMPLETE
1 series · 14 of 25 positions shown · non-contrast
Comparison: 03/21/2016

CLINICAL DATA: Hepatitis-B

EXAM:
ABDOMEN ULTRASOUND COMPLETE

[Series 1: us abdomen complete · 0.19mm/px · 14 of 95 slices shown]
[im 1/95]
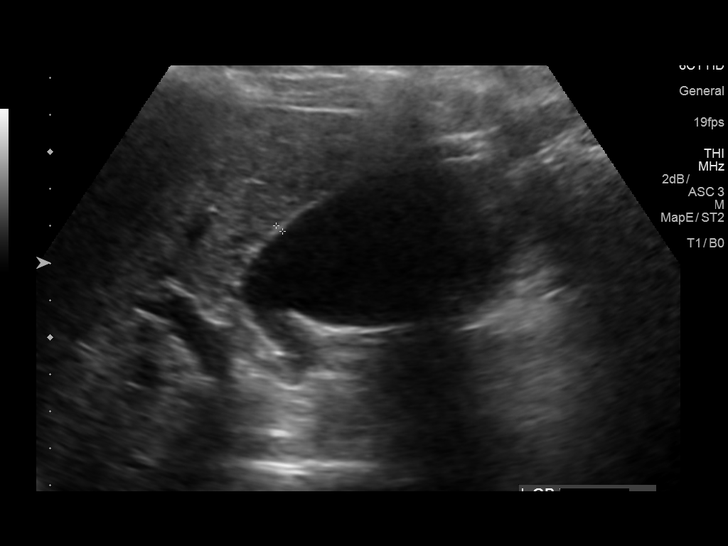
[im 8/95]
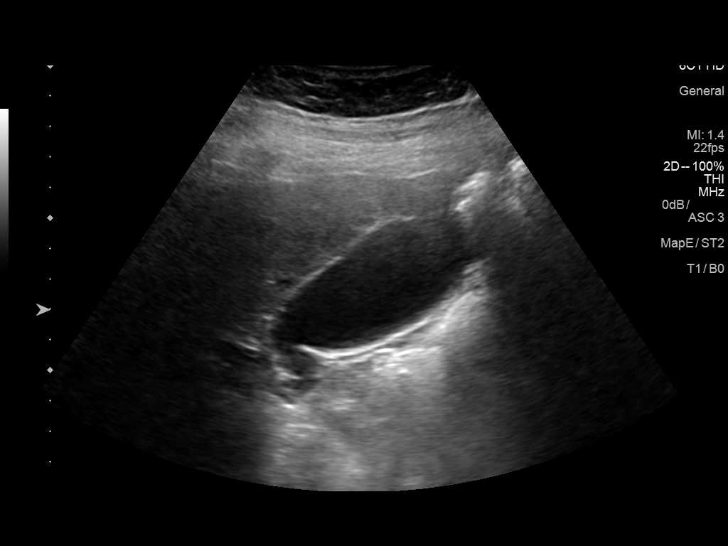
[im 16/95]
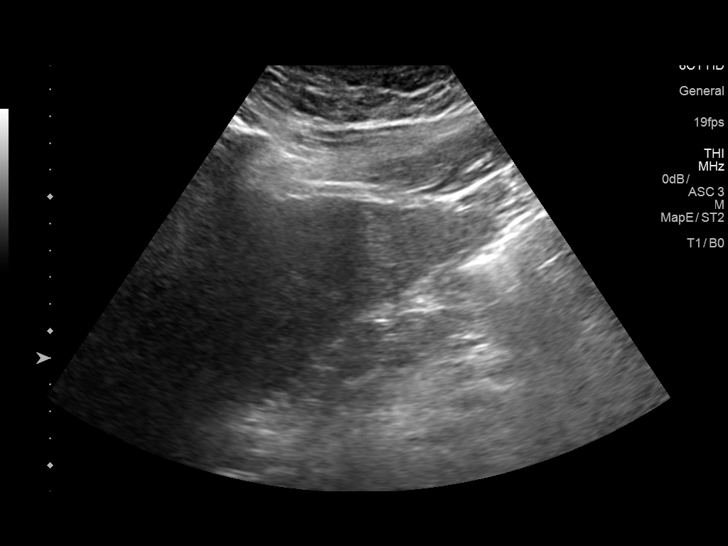
[im 24/95]
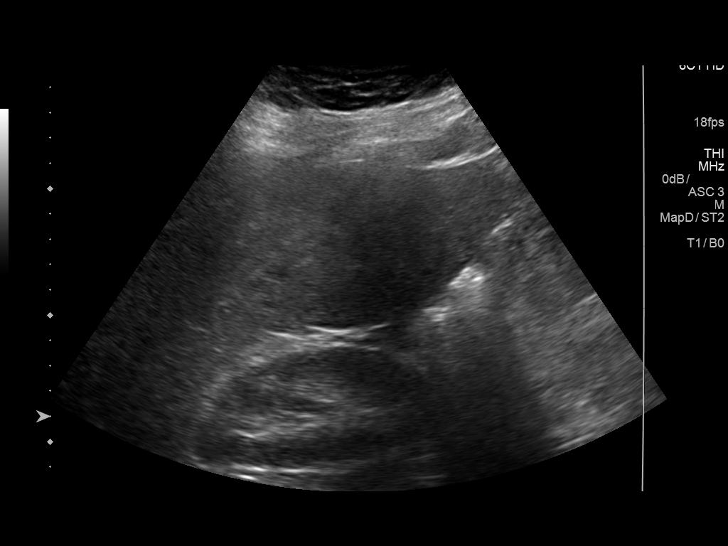
[im 32/95]
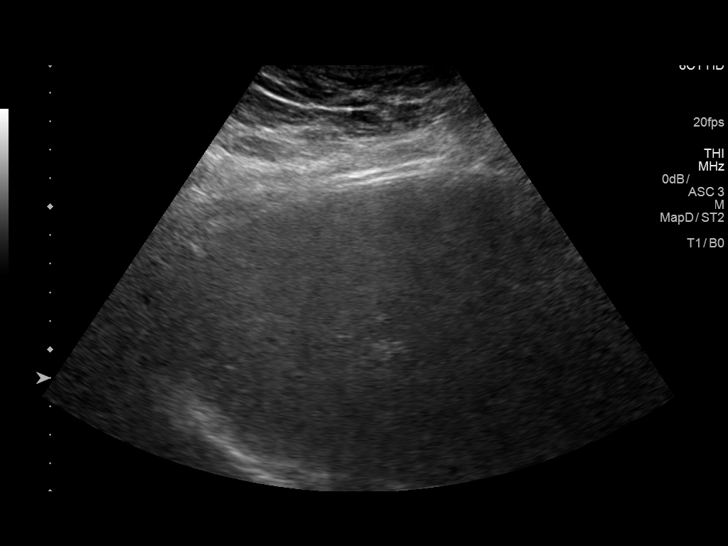
[im 36/95]
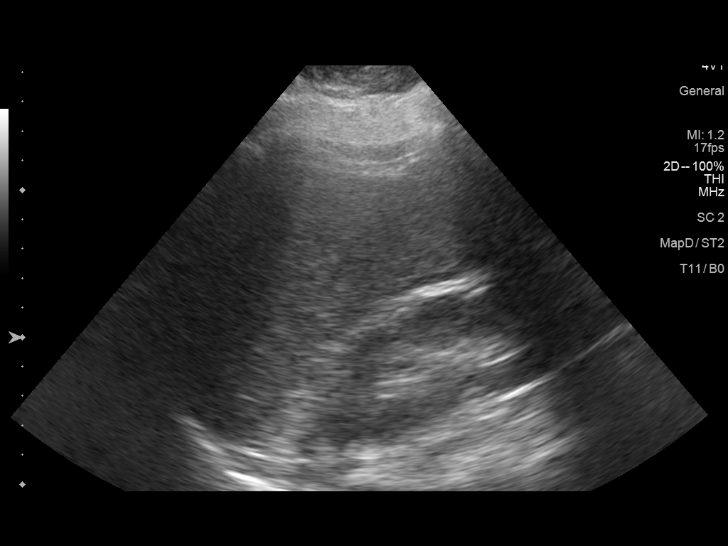
[im 44/95]
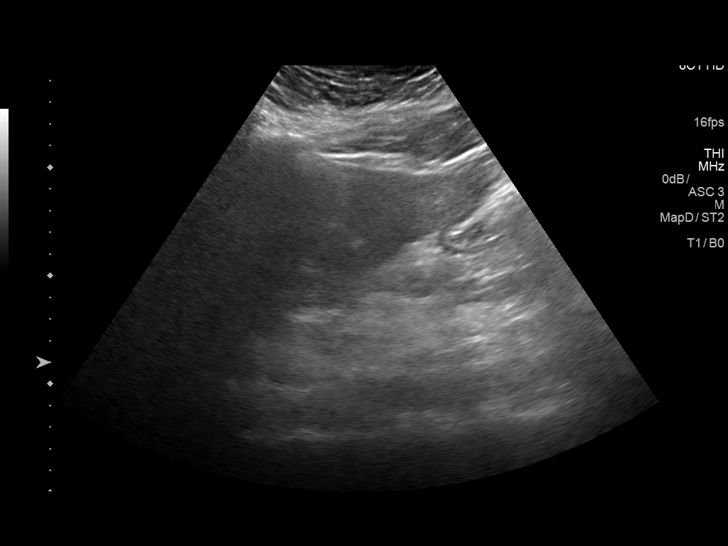
[im 51/95]
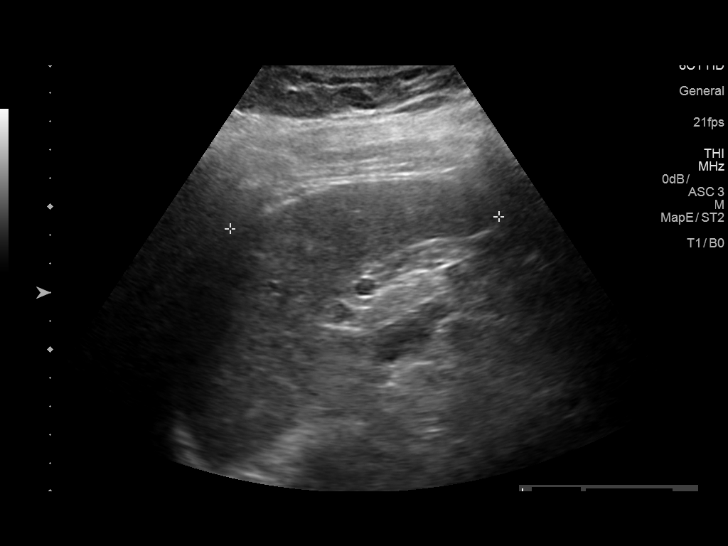
[im 59/95]
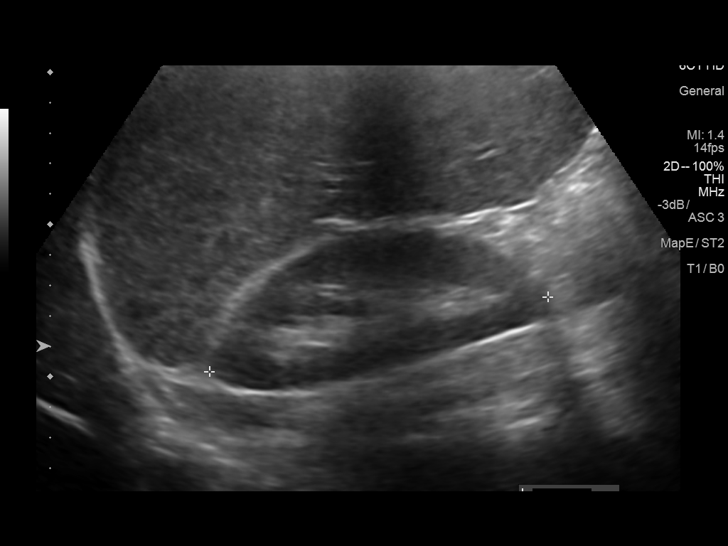
[im 63/95]
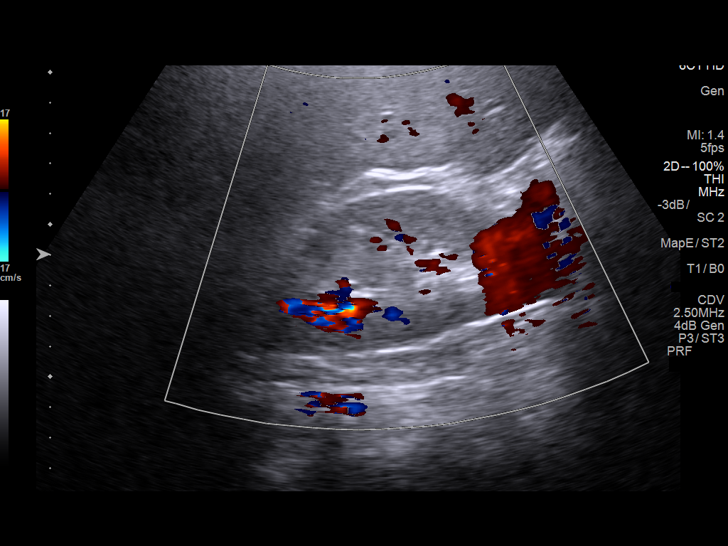
[im 71/95]
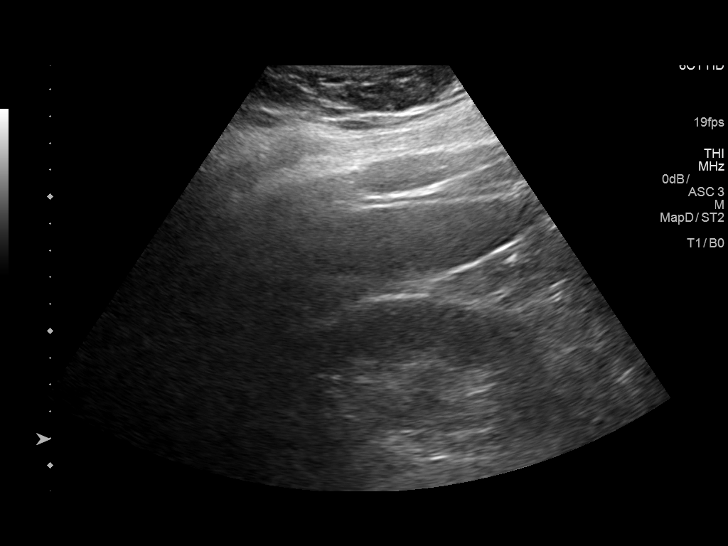
[im 79/95]
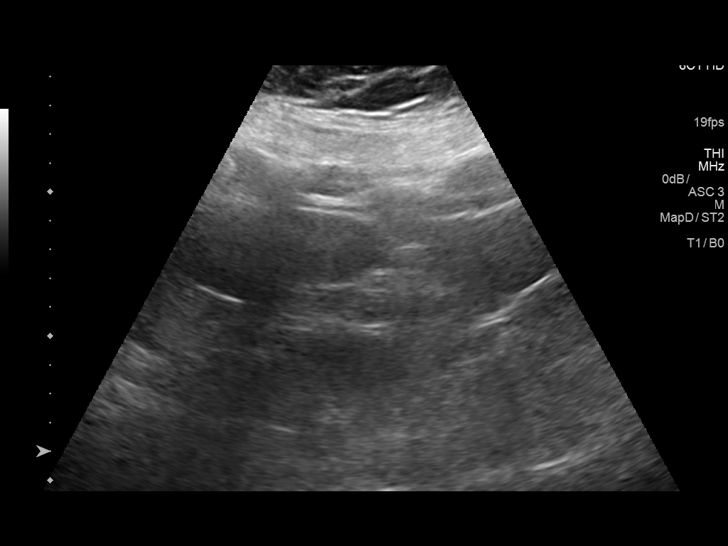
[im 87/95]
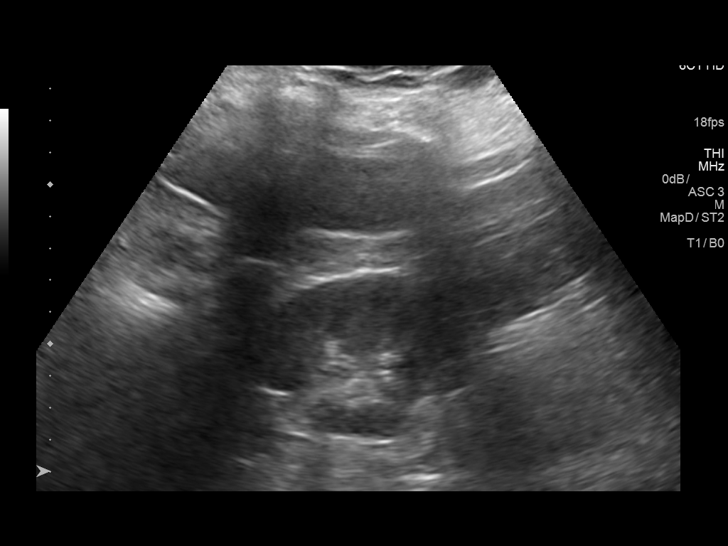
[im 95/95]
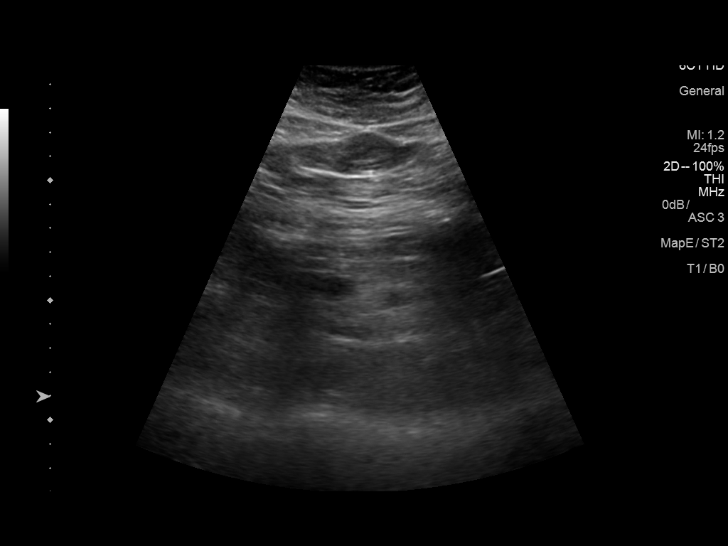

[14 of 25 positions shown; findings below may reference images not displayed]

FINDINGS: Gallbladder: No gallstones or wall thickening visualized. No
sonographic Murphy sign noted by sonographer.

Common bile duct: Diameter: Normal caliber, 5 mm

Liver: Coarsened echotexture throughout the liver. No focal
abnormality or biliary ductal dilatation. Portal vein is patent with
hepatopetal flow.

IVC: No abnormality visualized.

Pancreas: Visualized portion unremarkable.

Spleen: Size and appearance within normal limits.

Right Kidney: Length: 11.4 cm. Echogenicity within normal limits. No
mass or hydronephrosis visualized.

Left Kidney: Length: 11.8 cm. Echogenicity within normal limits. No
mass or hydronephrosis visualized.

Abdominal aorta: No aneurysm visualized.

Other findings: None.
IMPRESSION: Changes of cirrhosis.  No focal hepatic abnormality.

No acute findings.

## 2017-11-20 DIAGNOSIS — H40023 Open angle with borderline findings, high risk, bilateral: Secondary | ICD-10-CM | POA: Diagnosis not present

## 2018-03-06 ENCOUNTER — Other Ambulatory Visit: Payer: Self-pay | Admitting: Nurse Practitioner

## 2018-03-06 DIAGNOSIS — B181 Chronic viral hepatitis B without delta-agent: Secondary | ICD-10-CM

## 2018-03-06 DIAGNOSIS — K7469 Other cirrhosis of liver: Secondary | ICD-10-CM

## 2018-03-13 ENCOUNTER — Ambulatory Visit
Admission: RE | Admit: 2018-03-13 | Discharge: 2018-03-13 | Disposition: A | Discharge status: Home / Self Care | Payer: 59 | Source: Ambulatory Visit | Attending: Nurse Practitioner | Admitting: Nurse Practitioner

## 2018-03-13 DIAGNOSIS — B181 Chronic viral hepatitis B without delta-agent: Secondary | ICD-10-CM

## 2018-03-13 DIAGNOSIS — K7469 Other cirrhosis of liver: Secondary | ICD-10-CM

## 2018-04-11 ENCOUNTER — Encounter: Payer: Self-pay | Admitting: Gastroenterology

## 2018-04-11 ENCOUNTER — Ambulatory Visit (AMBULATORY_SURGERY_CENTER): Payer: 59 | Admitting: *Deleted

## 2018-04-11 VITALS — Ht 74.0 in | Wt 304.0 lb

## 2018-04-11 DIAGNOSIS — I8501 Esophageal varices with bleeding: Secondary | ICD-10-CM

## 2018-04-11 DIAGNOSIS — K7460 Unspecified cirrhosis of liver: Secondary | ICD-10-CM

## 2018-04-11 NOTE — Progress Notes (Signed)
No egg or soy allergy known to patient  No issues with past sedation with any surgeries  or procedures, no intubation problems  No diet pills per patient No home 02 use per patient  No blood thinners per patient  Pt denies issues with constipation  No A fib or A flutter  EMMI video sent to pt's e mail  

## 2018-04-24 ENCOUNTER — Ambulatory Visit (AMBULATORY_SURGERY_CENTER): Payer: 59 | Admitting: Gastroenterology

## 2018-04-24 ENCOUNTER — Encounter: Payer: Self-pay | Admitting: Gastroenterology

## 2018-04-24 VITALS — BP 153/90 | HR 74 | Temp 97.8°F | Resp 15 | Ht 74.0 in | Wt 304.0 lb

## 2018-04-24 DIAGNOSIS — K7460 Unspecified cirrhosis of liver: Secondary | ICD-10-CM | POA: Diagnosis not present

## 2018-04-24 DIAGNOSIS — K2970 Gastritis, unspecified, without bleeding: Secondary | ICD-10-CM | POA: Diagnosis not present

## 2018-04-24 DIAGNOSIS — I8510 Secondary esophageal varices without bleeding: Secondary | ICD-10-CM

## 2018-04-24 DIAGNOSIS — K2990 Gastroduodenitis, unspecified, without bleeding: Secondary | ICD-10-CM

## 2018-04-24 MED ORDER — OMEPRAZOLE 40 MG PO CPDR: 40.0000 mg | DELAYED_RELEASE_CAPSULE | Freq: Every day | ORAL | 0 refills | Status: DC

## 2018-04-24 MED ORDER — SODIUM CHLORIDE 0.9 % IV SOLN: 500.0000 mL | Freq: Once | INTRAVENOUS | Status: DC

## 2018-04-24 NOTE — Op Note (Addendum)
[facility] Patient Name: Cordelle Dahmen Procedure Date: 04/24/2018 10:03 AM MRN: [id number] Endoscopist: Mallie Mussel L. Loletha Carrow , MD Age: 52 Referring MD:  Date of Birth: 11-29-1965 Gender: Male Account #: [id number] Procedure:                Upper GI endoscopy Indications:              Cirrhosis rule out esophageal varices (cirrhosis                            from HBV, on therapy. recent platelet count 116K,                            INR 1.0) Medicines:                Monitored Anesthesia Care Procedure:                Pre-Anesthesia Assessment:                           - Prior to the procedure, a History and Physical                            was performed, and patient medications and                            allergies were reviewed. The patient's tolerance of                            previous anesthesia was also reviewed. The risks                            and benefits of the procedure and the sedation                            options and risks were discussed with the patient.                            All questions were answered, and informed consent                            was obtained. Prior Anticoagulants: The patient has                            taken no previous anticoagulant or antiplatelet                            agents. ASA Grade Assessment: II - A patient with                            mild systemic disease. After reviewing the risks                            and benefits, the patient was deemed in  satisfactory condition to undergo the procedure.                           After obtaining informed consent, the endoscope was                            passed under direct vision. Throughout the                            procedure, the patient's blood pressure, pulse, and                            oxygen saturations were monitored continuously. The                            Endoscope was introduced through the mouth,  and                            advanced to the second part of duodenum. The upper                            GI endoscopy was accomplished without difficulty.                            The patient tolerated the procedure well. Scope In: Scope Out: Findings:                 The esophagus was normal.                           There was no endoscopic evidence of varices in the                            entire esophagus.                           Diffuse inflammation characterized by adherent                            blood, congestion (edema), erosions and erythema                            was found in the entire examined stomach. Biopsies                            were taken from the antrum with a cold forceps for                            Helicobacter pylori testing using CLOtest.(patient                            not on a PPI)                           There was no endoscopic evidence of varices in the  cardia or in the gastric fundus.                           The cardia and gastric fundus were normal on                            retroflexion.                           Multiple superficial duodenal ulcers with friable                            mucosa and a mild stricture were found in the                            duodenal bulb and proximal sweep. No difficulty                            passing scope through stricture. Complications:            No immediate complications. Estimated Blood Loss:     Estimated blood loss was minimal. Impression:               - Normal esophagus.                           - Gastritis. Biopsied.                           - Multiple shallow duodenal ulcers with stricture. Recommendation:           - Patient has a contact number available for                            emergencies. The signs and symptoms of potential                            delayed complications were discussed with the                             patient. Return to normal activities tomorrow.                            Written discharge instructions were provided to the                            patient.                           - Resume previous diet.                           - Continue present medications.                           - Use Prilosec (omeprazole) 40 mg by mouth daily                            (  before a meal) for 4 weeks. Disp #30, RF zero                           - Repeat upper endoscopy in 3 years for variceal                            screening purposes. Hilton Saephan L. Loletha Carrow, MD 04/24/2018 10:34:31 AM This report has been signed electronically.

## 2018-04-24 NOTE — Progress Notes (Signed)
A and O x3. Report to RN. Tolerated MAC anesthesia well.Teeth unchanged after procedure.

## 2018-04-24 NOTE — Progress Notes (Signed)
Called to room to assist during endoscopic procedure.  Patient ID and intended procedure confirmed with present staff. Received instructions for my participation in the procedure from the performing physician.  

## 2018-04-24 NOTE — Patient Instructions (Addendum)
Handouts provided:  Gastritis  START taking Prilosec 40 mg by mouth daily (before a meal) for 4 weeks.   YOU HAD AN ENDOSCOPIC PROCEDURE TODAY AT [facility]:   Refer to the procedure report that was given to you for any specific questions about what was found during the examination.  If the procedure report does not answer your questions, please call your gastroenterologist to clarify.  If you requested that your care partner not be given the details of your procedure findings, then the procedure report has been included in a sealed envelope for you to review at your convenience later.  YOU SHOULD EXPECT: Some feelings of bloating in the abdomen. Passage of more gas than usual.  Walking can help get rid of the air that was put into your GI tract during the procedure and reduce the bloating. If you had a lower endoscopy (such as a colonoscopy or flexible sigmoidoscopy) you may notice spotting of blood in your stool or on the toilet paper. If you underwent a bowel prep for your procedure, you may not have a normal bowel movement for a few days.  Please Note:  You might notice some irritation and congestion in your nose or some drainage.  This is from the oxygen used during your procedure.  There is no need for concern and it should clear up in a day or so.  SYMPTOMS TO REPORT IMMEDIATELY:   Following upper endoscopy (EGD)  Vomiting of blood or coffee ground material  New chest pain or pain under the shoulder blades  Painful or persistently difficult swallowing  New shortness of breath  Fever of 100F or higher  Black, tarry-looking stools  For urgent or emergent issues, a gastroenterologist can be reached at any hour by calling [phone].   DIET:  We do recommend a small meal at first, but then you may proceed to your regular diet.  Drink plenty of fluids but you should avoid alcoholic beverages for 24 hours.  ACTIVITY:  You should plan to take it easy for the rest of  today and you should NOT DRIVE or use heavy machinery until tomorrow (because of the sedation medicines used during the test).    FOLLOW UP: Our staff will call the number listed on your records the next business day following your procedure to check on you and address any questions or concerns that you may have regarding the information given to you following your procedure. If we do not reach you, we will leave a message.  However, if you are feeling well and you are not experiencing any problems, there is no need to return our call.  We will assume that you have returned to your regular daily activities without incident.  If any biopsies were taken you will be contacted by phone or by letter within the next 1-3 weeks.  Please call us at [phone] if you have not heard about the biopsies in 3 weeks.    SIGNATURES/CONFIDENTIALITY: You and/or your care partner have signed paperwork which will be entered into your electronic medical record.  These signatures attest to the fact that that the information above on your After Visit Summary has been reviewed and is understood.  Full responsibility of the confidentiality of this discharge information lies with you and/or your care-partner.

## 2018-04-25 ENCOUNTER — Telehealth: Payer: Self-pay | Admitting: *Deleted

## 2018-04-25 LAB — HELICOBACTER PYLORI SCREEN-BIOPSY: UREASE: POSITIVE — AB

## 2018-04-25 NOTE — Telephone Encounter (Signed)
  Follow up Call-  Call back number 04/24/2018  Post procedure Call Back phone  # [phone]  Permission to leave phone message Yes  Some recent data might be hidden     Patient questions:  Do you have a fever, pain , or abdominal swelling? No. Pain Score  0 *  Have you tolerated food without any problems? Yes.    Have you been able to return to your normal activities? Yes.    Do you have any questions about your discharge instructions: Diet   No. Medications  No. Follow up visit  No.  Do you have questions or concerns about your Care? No.  Actions: * If pain score is 4 or above: No action needed, pain <4.

## 2018-05-02 ENCOUNTER — Other Ambulatory Visit: Payer: Self-pay

## 2018-05-02 MED ORDER — METRONIDAZOLE 250 MG PO TABS: 250.0000 mg | ORAL_TABLET | Freq: Four times a day (QID) | ORAL | 0 refills | Status: DC

## 2018-05-02 MED ORDER — BISMUTH SUBSALICYLATE 262 MG PO CHEW: 524.0000 mg | CHEWABLE_TABLET | Freq: Three times a day (TID) | ORAL | 0 refills | Status: DC

## 2018-05-02 MED ORDER — DOXYCYCLINE HYCLATE 100 MG PO CAPS: 100.0000 mg | ORAL_CAPSULE | Freq: Two times a day (BID) | ORAL | 0 refills | Status: DC

## 2018-06-29 ENCOUNTER — Other Ambulatory Visit: Payer: Self-pay

## 2018-06-29 ENCOUNTER — Telehealth: Payer: Self-pay

## 2018-06-29 DIAGNOSIS — A048 Other specified bacterial intestinal infections: Secondary | ICD-10-CM

## 2018-06-29 NOTE — Telephone Encounter (Signed)
-----   Message from Algernon Huxley, RN sent at 06/05/2018  3:42 PM EST ----- Regarding: FW: H pylori  ----- Message ----- From: Algernon Huxley, RN Sent: 06/04/2018 To: Algernon Huxley, RN Subject: H pylori                                       Pt needs urea breath test

## 2018-06-29 NOTE — Telephone Encounter (Signed)
Spoke with pt and he knows to [facility] at [address] to have breath test done. Order mailed to pt.

## 2018-07-12 ENCOUNTER — Telehealth: Payer: Self-pay | Admitting: Gastroenterology

## 2018-07-12 MED ORDER — LEVOFLOXACIN 500 MG PO TABS: 500.0000 mg | ORAL_TABLET | Freq: Every day | ORAL | 0 refills | Status: AC

## 2018-07-12 MED ORDER — AMOXICILLIN 500 MG PO CAPS: 1000.0000 mg | ORAL_CAPSULE | Freq: Two times a day (BID) | ORAL | 0 refills | Status: AC

## 2018-07-12 MED ORDER — OMEPRAZOLE 20 MG PO CPDR: 20.0000 mg | DELAYED_RELEASE_CAPSULE | Freq: Two times a day (BID) | ORAL | 0 refills | Status: DC

## 2018-07-12 NOTE — Telephone Encounter (Signed)
Unfortunately, the breath test shows that he still has the stomach infection.  The bacteria must be resistant to one or more of the antibiotics he took.  We need to treat again with a different set of antibiotics.  Prescriptions were sent for amoxicillin and levofloxacin and omeprazole x 14 days.   He needs a repeat urea breath test at least 2 weeks after finishing the antibiotics and at least 5 days off omeprazole.

## 2018-07-13 NOTE — Telephone Encounter (Signed)
The patient has been notified of this information and all questions answered.   The pt will call after completing abx to set up breath test.

## 2018-08-10 ENCOUNTER — Other Ambulatory Visit: Payer: Self-pay

## 2018-08-10 ENCOUNTER — Telehealth: Payer: Self-pay | Admitting: Gastroenterology

## 2018-08-10 DIAGNOSIS — A048 Other specified bacterial intestinal infections: Secondary | ICD-10-CM

## 2018-08-10 NOTE — Telephone Encounter (Signed)
Pt needs to schedule H-pylori breath test, he stated that he finished treatment with antibiotic 2 weeks ago. Pls call him.

## 2018-08-10 NOTE — Telephone Encounter (Signed)
Pt has been off of meds for 2 weeks. Order mailed to pt for H pylori breath test to take to [facility], Pt aware.

## 2018-10-23 ENCOUNTER — Telehealth: Payer: Self-pay | Admitting: Gastroenterology

## 2018-10-23 NOTE — Telephone Encounter (Signed)
Breath test unfortunately shows that he is still positive for the H. pylori stomach bacteria despite the 2 previous antibiotic regimens.  (For documentation and future consultants:  First treatment course was bismuth, metronidazole, doxycycline (though unclear if ever took the doxycycline based on rx records. 2nd course was amoxicillin and levofloxacin)  He had multiple gastric ulcers, which means we really want to eradicate this bacteria if possible.  I would like him to be seen by an infectious disease consultant regarding this.  Please let the patient know and send a referral and make arrangements.

## 2018-10-24 ENCOUNTER — Other Ambulatory Visit: Payer: Self-pay

## 2018-10-24 DIAGNOSIS — A048 Other specified bacterial intestinal infections: Secondary | ICD-10-CM

## 2018-10-24 NOTE — Telephone Encounter (Signed)
Spoke with pt and he is aware. Referral entered in epic for ID. Pt knows he will receive a call from their office regarding appt.

## 2018-11-05 ENCOUNTER — Ambulatory Visit: Payer: 59 | Admitting: Internal Medicine

## 2018-11-05 ENCOUNTER — Other Ambulatory Visit: Payer: Self-pay

## 2018-11-05 ENCOUNTER — Telehealth: Payer: Self-pay

## 2018-11-05 ENCOUNTER — Encounter: Payer: Self-pay | Admitting: Internal Medicine

## 2018-11-05 VITALS — BP 155/93 | HR 73 | Temp 98.0°F | Wt 296.0 lb

## 2018-11-05 DIAGNOSIS — B9681 Helicobacter pylori [H. pylori] as the cause of diseases classified elsewhere: Secondary | ICD-10-CM | POA: Diagnosis not present

## 2018-11-05 DIAGNOSIS — B181 Chronic viral hepatitis B without delta-agent: Secondary | ICD-10-CM | POA: Diagnosis not present

## 2018-11-05 DIAGNOSIS — K259 Gastric ulcer, unspecified as acute or chronic, without hemorrhage or perforation: Secondary | ICD-10-CM | POA: Diagnosis not present

## 2018-11-05 MED ORDER — CLARITHROMYCIN 500 MG PO TABS: 500.0000 mg | ORAL_TABLET | Freq: Two times a day (BID) | ORAL | 0 refills | Status: AC

## 2018-11-05 MED ORDER — OMEPRAZOLE 20 MG PO CPDR: 20.0000 mg | DELAYED_RELEASE_CAPSULE | Freq: Two times a day (BID) | ORAL | 0 refills | Status: DC

## 2018-11-05 MED ORDER — METRONIDAZOLE 500 MG PO TABS: 500.0000 mg | ORAL_TABLET | Freq: Three times a day (TID) | ORAL | 0 refills | Status: AC

## 2018-11-05 MED ORDER — AMOXICILLIN 500 MG PO CAPS: 1000.0000 mg | ORAL_CAPSULE | Freq: Two times a day (BID) | ORAL | 0 refills | Status: AC

## 2018-11-05 NOTE — Progress Notes (Signed)
RFV: follow up for recurrent h.pylori  Patient ID: Arthur Wright, male   DOB: 01/31/1966, 53 y.o.   MRN: [id number]  HPI Arthur Wright is a 53yo M with history of recurrent h.pylori. he reports that he was having epigastric discomfort that initially led to him having EGD that found multiple gastric ulcers. First treatment course was bismuth, metronidazole, doxycycline (though unclear if ever took the doxycycline based on rx records.  In feb he was prescribed amoxicillin and levofloxacin and omeprazole x 14 days.  nowever repeat breath test in may still +. In addition, he has multiple gastric ulcers, which means we really want to eradicate this bacteria if possible. He was referred by dr Loletha Carrow for 2nd opinion to next treatment course  Social History   Tobacco Use  . Smoking status: Never Smoker  . Smokeless tobacco: Never Used  Substance Use Topics  . Alcohol use: Not Currently    Alcohol/week: 1.0 standard drinks    Types: 1 Standard drinks or equivalent per week    Comment: stopped due to cirrhosis  . 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 Has been in [place] since 2002. His oldest son maybe going to [employer]. He has a son at [place]  family history includes Hepatitis B in his brother and father; Hypertension in his father; Liver cancer in his brother.  Outpatient Encounter Medications as of 11/05/2018  Medication Sig  . bismuth subsalicylate (PEPTO BISMOL) 262 MG chewable tablet Chew 2 tablets (524 mg total) by mouth 4 (four) times daily -  before meals and at bedtime.  Marland Kitchen entecavir (BARACLUDE) 0.5 MG tablet Take 0.5 mg by mouth daily.  . metroNIDAZOLE (FLAGYL) 250 MG tablet Take 1 tablet (250 mg total) by mouth 4 (four) times daily. (Patient not taking: Reported on 11/05/2018)  . omeprazole (PRILOSEC) 20 MG capsule Take 1 capsule (20 mg total) by mouth 2 (two) times daily before a meal for 14 days.   No  facility-administered encounter medications on file as of 11/05/2018.      Patient Active Problem List   Diagnosis Date Noted  . Chronic hepatitis B ([place]) 11/22/2014  . Obesity 11/22/2014     Health Maintenance Due  Topic Date Due  . HIV Screening  11/16/1981  . COLONOSCOPY  11/16/2016     Review of Systems Review of Systems  Constitutional: Negative for fever, chills, diaphoresis, activity change, appetite change, fatigue and unexpected weight change.  HENT: Negative for congestion, sore throat, rhinorrhea, sneezing, trouble swallowing and sinus pressure.  Eyes: Negative for photophobia and visual disturbance.  Respiratory: Negative for cough, chest tightness, shortness of breath, wheezing and stridor.  Cardiovascular: Negative for chest pain, palpitations and leg swelling.  Gastrointestinal: Negative for nausea, vomiting, abdominal pain, diarrhea, constipation, blood in stool, abdominal distention and anal bleeding.  Genitourinary: Negative for dysuria, hematuria, flank pain and difficulty urinating.  Musculoskeletal: Negative for myalgias, back pain, joint swelling, arthralgias and gait problem.  Skin: Negative for color change, pallor, rash and wound.  Neurological: Negative for dizziness, tremors, weakness and light-headedness.  Hematological: Negative for adenopathy. Does not bruise/bleed easily.  Psychiatric/Behavioral: Negative for behavioral problems, confusion, sleep disturbance, dysphoric mood, decreased concentration and agitation.    Physical Exam   BP (!) 155/93   Pulse 73   Temp 98 F (36.7 C) (Oral)   Wt 296 lb (134.3 kg)   BMI 38.00 kg/m  Physical Exam  Constitutional: He is oriented to person, place, and time. He appears well-developed and well-nourished. No distress.  HENT:  Mouth/Throat: Oropharynx is clear and moist. No oropharyngeal exudate.  Cardiovascular: Normal rate, regular rhythm and normal heart sounds. Exam reveals no gallop and no friction  rub.  No murmur heard.  Pulmonary/Chest: Effort normal and breath sounds normal. No respiratory distress. He has no wheezes.  Abdominal: Soft. Bowel sounds are normal. He exhibits no distension. There is no tenderness.  Lymphadenopathy:  He has no cervical adenopathy.  Neurological: He is alert and oriented to person, place, and time.  Skin: Skin is warm and dry. No rash noted. No erythema.  Psychiatric: He has a normal mood and affect. His behavior is normal.    CBC Lab Results  Component Value Date   WBC 5.6 03/29/2016   RBC 5.54 03/29/2016   HGB 14.0 03/29/2016   HCT 43.7 03/29/2016   PLT 151 03/29/2016   MCV 78.9 03/29/2016   MCH 25.3 (L) 03/29/2016   MCHC 32.0 03/29/2016   RDW 13.3 03/29/2016   LYMPHSABS 2.4 10/30/2006   MONOABS 0.5 10/30/2006   EOSABS 0.1 10/30/2006    BMET Lab Results  Component Value Date   NA 135 12/22/2015   K 4.3 12/22/2015   CL 102 12/22/2015   CO2 25 12/22/2015   GLUCOSE 87 12/22/2015   BUN 12 12/22/2015   CREATININE 1.04 12/22/2015   CALCIUM 9.0 12/22/2015    No Known Allergies   Assessment and Plan  Recurrent h.pylroi = It doesn't appear he had clarithro based regimen - will plan on 14d of ppi sd, clarithro 500 bid, amox 1gm bid, metronidazole 500mg  TID . If fails then will need to see prove if he has clarithro R by cx/egd. Last result would be rifabutin based regimens.  -will ask danis'team to coordinate urea breath test in roughly 4 wk. If still + would recommend to repeat EGD and send culture for resistance testing  Chronic hepatitis b = continues baraclude

## 2018-11-05 NOTE — Telephone Encounter (Signed)
Prior authorization request for omeprazole sent to insurance plan via coverMymeds.  Request noted as favorable and faxed to pharmacy.  Eugenia Mcalpine, LPN

## 2018-11-09 DIAGNOSIS — B9681 Helicobacter pylori [H. pylori] as the cause of diseases classified elsewhere: Secondary | ICD-10-CM | POA: Insufficient documentation

## 2018-11-09 DIAGNOSIS — K259 Gastric ulcer, unspecified as acute or chronic, without hemorrhage or perforation: Secondary | ICD-10-CM | POA: Insufficient documentation

## 2018-11-12 ENCOUNTER — Telehealth: Payer: Self-pay | Admitting: Gastroenterology

## 2018-11-12 ENCOUNTER — Other Ambulatory Visit: Payer: Self-pay

## 2018-11-12 ENCOUNTER — Other Ambulatory Visit: Payer: Self-pay | Admitting: Nurse Practitioner

## 2018-11-12 DIAGNOSIS — A048 Other specified bacterial intestinal infections: Secondary | ICD-10-CM

## 2018-11-12 DIAGNOSIS — K7469 Other cirrhosis of liver: Secondary | ICD-10-CM

## 2018-11-12 NOTE — Telephone Encounter (Signed)
Left message to return call. Order has been printed for the repeat H.pylori breath test.

## 2018-11-12 NOTE — Telephone Encounter (Signed)
Patient seen by Dr. Baxter Flattery of Infectious Disease, and will take another H. Pylori treatment course.  Please communicate with patient regarding when he has or will start this treatment.  Coordinate a Urea Breath test to be performed2-3 weeks after completing the medicines.  No PPI at least 5 days prior to UBT.

## 2018-11-14 NOTE — Telephone Encounter (Signed)
Left a message to return call to for instructions on repeat H. Pylori breath test.

## 2018-11-14 NOTE — Telephone Encounter (Signed)
Patient returned call. He says he is about half way done with the treatment, he started 11-03-2018. He states clear understanding when and how to proceed with the h.pylori breath test. Instructions and order have been mailed.

## 2018-11-21 ENCOUNTER — Other Ambulatory Visit: Payer: Self-pay

## 2018-11-21 ENCOUNTER — Encounter: Payer: Self-pay | Admitting: Registered Nurse

## 2018-11-21 ENCOUNTER — Ambulatory Visit (INDEPENDENT_AMBULATORY_CARE_PROVIDER_SITE_OTHER): Payer: 59 | Admitting: Registered Nurse

## 2018-11-21 VITALS — BP 122/72 | HR 76 | Temp 98.7°F | Resp 16 | Ht 73.03 in | Wt 300.0 lb

## 2018-11-21 DIAGNOSIS — Z0000 Encounter for general adult medical examination without abnormal findings: Secondary | ICD-10-CM

## 2018-11-21 DIAGNOSIS — Z13220 Encounter for screening for lipoid disorders: Secondary | ICD-10-CM | POA: Diagnosis not present

## 2018-11-21 DIAGNOSIS — Z1211 Encounter for screening for malignant neoplasm of colon: Secondary | ICD-10-CM

## 2018-11-21 DIAGNOSIS — Z1329 Encounter for screening for other suspected endocrine disorder: Secondary | ICD-10-CM | POA: Diagnosis not present

## 2018-11-21 DIAGNOSIS — Z1159 Encounter for screening for other viral diseases: Secondary | ICD-10-CM

## 2018-11-21 DIAGNOSIS — Z130 Encounter for screening for diseases of the blood and blood-forming organs and certain disorders involving the immune mechanism: Secondary | ICD-10-CM

## 2018-11-21 DIAGNOSIS — Z13228 Encounter for screening for other metabolic disorders: Secondary | ICD-10-CM

## 2018-11-21 LAB — LIPID PANEL

## 2018-11-21 NOTE — Progress Notes (Signed)
Established Patient Office Visit  Subjective:  Patient ID: Arthur Wright, male    DOB: 1965/08/16  Age: 54 y.o. MRN: [id number]  CC:  Chief Complaint  Patient presents with  . Annual Exam    HPI Arthur Wright presents for a visit to establish care, CPE, and labs.  He has chronic hepatitis B with cirrhosis that is being managed through GI. He is also seeing GI for a recurrent H. Pylori infection, for which he is in the middle of a course of treatment.  His Hep B is being monitored closely and he has an upcoming liver U/S on Monday, 11/26/18. He has noticed no recent changes.   His H. Pylori has felt under control. He is adhering to the treatment regimen for the infection and will return to GI for his follow up breath test in mid July. He is aware of instructions prior to repeat testing.   He reports no chronic concerns today. He is due for colon cancer screening. He opts for cologuard, is aware of risk/benefit.   He will receive routine labs today: Cbc, CMP, A1c, TSH, Lipid Panel, and HIV test.  He is due for an eye exam this year, has delayed d/t COVID-19  Past Medical History:  Diagnosis Date  . Cirrhosis ([place])   . H. pylori infection   . Hepatitis B   . Liver fibrosis   . Thrombocythemia [facility])     Past Surgical History:  Procedure Laterality Date  . NO PAST SURGERIES      Family History  Problem Relation Age of Onset  . Hepatitis B Father   . Hypertension Father   . Hepatitis B Brother   . Liver cancer Brother   . Colon cancer Neg Hx   . Colon polyps Neg Hx   . Esophageal cancer Neg Hx   . Rectal cancer Neg Hx   . Stomach cancer Neg Hx     Social History   Socioeconomic History  . Marital status: Married    Spouse name: Not on file  . Number of children: 4  . Years of education: Not on file  . Highest education level: Not on file  Occupational History  . Not on file  Social Needs  . Financial resource strain: Not hard at all  . Food insecurity     Worry: Never true    Inability: Never true  . Transportation needs    Medical: No    Non-medical: No  Tobacco Use  . Smoking status: Never Smoker  . Smokeless tobacco: Never Used  Substance and Sexual Activity  . Alcohol use: Not Currently    Alcohol/week: 1.0 standard drinks    Types: 1 Standard drinks or equivalent per week    Comment: stopped due to cirrhosis  . Drug use: No  . Sexual activity: Not on file  Lifestyle  . Physical activity    Days per week: 5 days    Minutes per session: 60 min  . Stress: Only a little  Relationships  . Social Herbalist on phone: Three times a week    Gets together: Twice a week    Attends religious service: More than 4 times per year    Active member of club or organization: No    Attends meetings of clubs or organizations: Never    Relationship status: Married  . Intimate partner violence    Fear of current or ex partner: No    Emotionally abused: No  Physically abused: No    Forced sexual activity: No  Other Topics Concern  . Not on file  Social History Narrative  . Not on file    Outpatient Medications Prior to Visit  Medication Sig Dispense Refill  . amoxicillin (AMOXIL) 500 MG tablet Take 500 mg by mouth 2 (two) times daily.    . clarithromycin (BIAXIN) 500 MG tablet Take 500 mg by mouth 2 (two) times daily.    Marland Kitchen entecavir (BARACLUDE) 0.5 MG tablet Take 0.5 mg by mouth daily.    . metroNIDAZOLE (FLAGYL) 500 MG tablet Take 500 mg by mouth 3 (three) times daily.    Marland Kitchen omeprazole (PRILOSEC) 20 MG capsule Take 1 capsule (20 mg total) by mouth 2 (two) times daily before a meal for 14 days. 28 capsule 0  . bismuth subsalicylate (PEPTO BISMOL) 262 MG chewable tablet Chew 2 tablets (524 mg total) by mouth 4 (four) times daily -  before meals and at bedtime. 80 tablet 0   No facility-administered medications prior to visit.     No Known Allergies  ROS Review of Systems  Constitutional: Negative.   HENT: Negative.    Eyes: Negative.   Respiratory: Negative.   Cardiovascular: Negative.   Gastrointestinal: Negative.   Endocrine: Negative.   Genitourinary: Negative.   Musculoskeletal: Negative.   Skin: Negative.   Allergic/Immunologic: Negative.   Neurological: Negative.   Hematological: Negative.   Psychiatric/Behavioral: Negative.       Objective:    Physical Exam  Constitutional: He is oriented to person, place, and time. He appears well-developed and well-nourished. No distress.  HENT:  Head: Normocephalic and atraumatic.  Right Ear: Hearing, tympanic membrane, external ear and ear canal normal.  Left Ear: Hearing, tympanic membrane, external ear and ear canal normal.  Nose: Nose normal. No mucosal edema or rhinorrhea.  Mouth/Throat: Uvula is midline, oropharynx is clear and moist and mucous membranes are normal. Mucous membranes are not pale, not dry and not cyanotic. Normal dentition. No oropharyngeal exudate or posterior oropharyngeal edema.  Eyes: Pupils are equal, round, and reactive to light. Conjunctivae and EOM are normal. Right eye exhibits no discharge. Left eye exhibits no discharge. No scleral icterus.  Neck: Normal range of motion. Neck supple. No JVD present. No tracheal deviation present. No thyromegaly present.  Cardiovascular: Normal rate, regular rhythm, normal heart sounds and intact distal pulses. Exam reveals no gallop and no friction rub.  No murmur heard. Pulmonary/Chest: Effort normal and breath sounds normal. No stridor. No respiratory distress. He has no wheezes. He has no rales. He exhibits no tenderness.  Abdominal: Soft. Bowel sounds are normal. He exhibits no distension and no mass. There is no splenomegaly. There is no abdominal tenderness. There is no rebound, no guarding and no CVA tenderness. No hernia. Hernia confirmed negative in the ventral area, confirmed negative in the right inguinal area and confirmed negative in the left inguinal area.     Musculoskeletal: Normal range of motion.        General: No tenderness, deformity or edema.  Lymphadenopathy:    He has no cervical adenopathy.  Neurological: He is alert and oriented to person, place, and time. No cranial nerve deficit. Coordination normal.  Skin: Skin is warm and dry. No rash noted. He is not diaphoretic. No erythema. No pallor.  Psychiatric: He has a normal mood and affect. His behavior is normal. Judgment and thought content normal.    BP 122/72   Pulse 76   Temp 98.7  F (37.1 C) (Oral)   Resp 16   Ht 6' 1.03" (1.855 m)   Wt 300 lb (136.1 kg)   SpO2 97%   BMI 39.55 kg/m  Wt Readings from Last 3 Encounters:  11/21/18 300 lb (136.1 kg)  11/05/18 296 lb (134.3 kg)  04/24/18 (!) 304 lb (137.9 kg)     Health Maintenance Due  Topic Date Due  . HIV Screening  11/16/1981  . COLONOSCOPY  11/16/2016    There are no preventive care reminders to display for this patient.  Lab Results  Component Value Date   TSH 0.92 12/22/2015   Lab Results  Component Value Date   WBC 5.6 03/29/2016   HGB 14.0 03/29/2016   HCT 43.7 03/29/2016   MCV 78.9 03/29/2016   PLT 151 03/29/2016   Lab Results  Component Value Date   NA 135 12/22/2015   K 4.3 12/22/2015   CO2 25 12/22/2015   GLUCOSE 87 12/22/2015   BUN 12 12/22/2015   CREATININE 1.04 12/22/2015   BILITOT 0.5 12/22/2015   ALKPHOS 93 12/22/2015   AST 69 (H) 12/22/2015   ALT 33 12/22/2015   PROT 9.2 (H) 12/22/2015   ALBUMIN 4.0 12/22/2015   CALCIUM 9.0 12/22/2015   Lab Results  Component Value Date   CHOL 136 12/22/2015   Lab Results  Component Value Date   HDL 52 12/22/2015   Lab Results  Component Value Date   LDLCALC 68 12/22/2015   Lab Results  Component Value Date   TRIG 79 12/22/2015   Lab Results  Component Value Date   CHOLHDL 2.6 12/22/2015   Lab Results  Component Value Date   HGBA1C 5.0 11/10/2012      Assessment & Plan:   Problem List Items Addressed This Visit    None     Visit Diagnoses    Screening for endocrine, metabolic and immunity disorder    -  Primary   Relevant Orders   CBC with Differential/Platelet   Comprehensive metabolic panel   Hemoglobin A1c   TSH   Lipid screening       Relevant Orders   Lipid panel   Special screening for malignant neoplasms, colon       Relevant Orders   Cologuard   Screening for viral disease       Relevant Orders   HIV antibody (with reflex)   Routine general medical examination at a health care facility          No orders of the defined types were placed in this encounter.   Follow-up: No follow-ups on file.   PLAN:  Routine labs today. Will follow up as warranted.  Physical exam without unexpected findings - liver span seems to be wide, as expected with cirrhosis and hepatitis B, however, exam obstructed by abdominal fat tissue.  Pt reports he has been losing weight consistently through diet and exercise. I encouraged this and would like to see him lose more weight by his next CPE - it will benefit both his GERD and liver function.  Continue to follow with GI  Return to clinic in 1 year or sooner with symptoms.  Patient encouraged to call clinic with any questions, comments, or concerns.    Maximiano Coss, NP

## 2018-11-21 NOTE — Patient Instructions (Signed)
° ° ° °  If you have lab work done today you will be contacted with your lab results within the next 2 weeks.  If you have not heard from us then please contact us. The fastest way to get your results is to register for My Chart. ° ° °IF you received an x-ray today, you will receive an invoice from [facility]. Please contact [facility] at [phone] with questions or concerns regarding your invoice.  ° °IF you received labwork today, you will receive an invoice from [facility]. Please contact [facility] at [phone] with questions or concerns regarding your invoice.  ° °Our billing staff will not be able to assist you with questions regarding bills from these companies. ° °You will be contacted with the lab results as soon as they are available. The fastest way to get your results is to activate your My Chart account. Instructions are located on the last page of this paperwork. If you have not heard from us regarding the results in 2 weeks, please contact this office. °  ° ° ° °

## 2018-11-22 ENCOUNTER — Other Ambulatory Visit: Payer: Self-pay | Admitting: Registered Nurse

## 2018-11-22 DIAGNOSIS — Z130 Encounter for screening for diseases of the blood and blood-forming organs and certain disorders involving the immune mechanism: Secondary | ICD-10-CM

## 2018-11-22 LAB — CBC WITH DIFFERENTIAL/PLATELET
Basophils Absolute: 0 10*3/uL (ref 0.0–0.2)
Basos: 0 %
EOS (ABSOLUTE): 0 10*3/uL (ref 0.0–0.4)
Eos: 1 %
Hematocrit: 39.6 % (ref 37.5–51.0)
Hemoglobin: 12.4 g/dL — ABNORMAL LOW (ref 13.0–17.7)
Immature Grans (Abs): 0 10*3/uL (ref 0.0–0.1)
Immature Granulocytes: 0 %
Lymphocytes Absolute: 2.5 10*3/uL (ref 0.7–3.1)
Lymphs: 42 %
MCH: 25 pg — ABNORMAL LOW (ref 26.6–33.0)
MCHC: 31.3 g/dL — ABNORMAL LOW (ref 31.5–35.7)
MCV: 80 fL (ref 79–97)
Monocytes Absolute: 0.4 10*3/uL (ref 0.1–0.9)
Monocytes: 7 %
Neutrophils Absolute: 3 10*3/uL (ref 1.4–7.0)
Neutrophils: 50 %
Platelets: 121 10*3/uL — ABNORMAL LOW (ref 150–450)
RBC: 4.96 x10E6/uL (ref 4.14–5.80)
RDW: 13.4 % (ref 11.6–15.4)
WBC: 6 10*3/uL (ref 3.4–10.8)

## 2018-11-22 LAB — HIV ANTIBODY (ROUTINE TESTING W REFLEX): HIV Screen 4th Generation wRfx: NONREACTIVE

## 2018-11-22 LAB — COMPREHENSIVE METABOLIC PANEL
ALT: 28 IU/L (ref 0–44)
AST: 79 IU/L — ABNORMAL HIGH (ref 0–40)
Albumin/Globulin Ratio: 0.6 — ABNORMAL LOW (ref 1.2–2.2)
Albumin: 3.4 g/dL — ABNORMAL LOW (ref 3.8–4.9)
Alkaline Phosphatase: 112 IU/L (ref 39–117)
BUN/Creatinine Ratio: 11 (ref 9–20)
BUN: 11 mg/dL (ref 6–24)
Bilirubin Total: <0.2 mg/dL (ref 0.0–1.2)
CO2: 21 mmol/L (ref 20–29)
Calcium: 8.9 mg/dL (ref 8.7–10.2)
Chloride: 101 mmol/L (ref 96–106)
Creatinine, Ser: 1.02 mg/dL (ref 0.76–1.27)
GFR calc Af Amer: 97 mL/min/{1.73_m2} (ref >59)
GFR calc non Af Amer: 84 mL/min/{1.73_m2} (ref >59)
Globulin, Total: 6.1 g/dL — ABNORMAL HIGH (ref 1.5–4.5)
Glucose: 71 mg/dL (ref 65–99)
Potassium: 4.2 mmol/L (ref 3.5–5.2)
Sodium: 135 mmol/L (ref 134–144)
Total Protein: 9.5 g/dL — ABNORMAL HIGH (ref 6.0–8.5)

## 2018-11-22 LAB — LIPID PANEL
Chol/HDL Ratio: 2.5 ratio (ref 0.0–5.0)
Cholesterol, Total: 113 mg/dL (ref 100–199)
HDL: 46 mg/dL (ref >39)
LDL Calculated: 47 mg/dL (ref 0–99)
Triglycerides: 102 mg/dL (ref 0–149)
VLDL Cholesterol Cal: 20 mg/dL (ref 5–40)

## 2018-11-22 LAB — HEMOGLOBIN A1C
Est. average glucose Bld gHb Est-mCnc: 103 mg/dL
Hgb A1c MFr Bld: 5.2 % (ref 4.8–5.6)

## 2018-11-22 LAB — TSH: TSH: 1.81 u[IU]/mL (ref 0.450–4.500)

## 2018-11-22 NOTE — Progress Notes (Signed)
APPNT MADE

## 2018-11-22 NOTE — Progress Notes (Signed)
Pt has abnormal CBC at office visit yesterday, 11/21/18. Should repeat in 3 mos for follow up.  Kathrin Ruddy, NP

## 2018-11-22 NOTE — Progress Notes (Signed)
Good afternoon Mr. Lumpkin,  It was wonderful to meet you yesterday. Your labs have returned, and overall, they're looking pretty good. Your cholesterol and blood sugar are well controlled, and your TSH is normal. Abnormalities were found on your CMP and CBC as follows: CMP: Your mildly altered total protein, albumin, Globulin, and AST are likely related to your Hepatitis B and the treatment you are receiving for this. I am not concerned about these numbers at this time. CBC: You are showing low Hemoglobin, MCH, MCHC, and Platelets. This is something I'd like to follow up on - given that you are currently being treated for an H. Pylori infection, I think we can wait around 3 months and then repeat a CBC to monitor changes in these numbers. I'm hoping they return to normal following treatment.  If you have any questions, feel free to call our office.  Thank you,  Kathrin Ruddy, NP

## 2018-11-26 ENCOUNTER — Ambulatory Visit
Admission: RE | Admit: 2018-11-26 | Discharge: 2018-11-26 | Disposition: A | Discharge status: Home / Self Care | Payer: 59 | Source: Ambulatory Visit | Attending: Nurse Practitioner | Admitting: Nurse Practitioner

## 2018-11-26 DIAGNOSIS — K7469 Other cirrhosis of liver: Secondary | ICD-10-CM

## 2018-12-17 ENCOUNTER — Ambulatory Visit: Payer: 59 | Admitting: Internal Medicine

## 2018-12-20 ENCOUNTER — Encounter: Payer: Self-pay | Admitting: Gastroenterology

## 2018-12-21 ENCOUNTER — Other Ambulatory Visit: Payer: Self-pay | Admitting: *Deleted

## 2018-12-21 ENCOUNTER — Encounter: Payer: Self-pay | Admitting: *Deleted

## 2018-12-21 ENCOUNTER — Telehealth: Payer: Self-pay | Admitting: Gastroenterology

## 2018-12-21 DIAGNOSIS — K7460 Unspecified cirrhosis of liver: Secondary | ICD-10-CM

## 2018-12-21 NOTE — Telephone Encounter (Signed)
Called the patient, been scheduled for the following:   12/31/2018 RUQ [place] at [facility] at 8:00 am  01/15/2019 f/u in the clinic with Dr. Loletha Carrow at 3:40 pm

## 2018-12-21 NOTE — Telephone Encounter (Signed)
Brittani,  please give him the good news that his breath test shows the H. pylori bacteria is finally cleared after recent antibiotics.  If he is still taking omeprazole, he only needs to do so for another 3 to 4 weeks, then can stop.  Regarding his cirrhosis, he needs to be scheduled for a right upper quadrant ultrasound to screen for hepatocellular carcinoma, and needs an in person clinic visit with me in August or September.   Caren Griffins,  Thank you for your help on the H. pylori treatment.

## 2018-12-24 ENCOUNTER — Other Ambulatory Visit: Payer: Self-pay

## 2018-12-24 ENCOUNTER — Encounter: Payer: Self-pay | Admitting: Internal Medicine

## 2018-12-24 ENCOUNTER — Ambulatory Visit (INDEPENDENT_AMBULATORY_CARE_PROVIDER_SITE_OTHER): Payer: 59 | Admitting: Internal Medicine

## 2018-12-24 VITALS — BP 130/98 | HR 73 | Temp 98.0°F | Ht 74.0 in | Wt 296.0 lb

## 2018-12-24 DIAGNOSIS — K259 Gastric ulcer, unspecified as acute or chronic, without hemorrhage or perforation: Secondary | ICD-10-CM | POA: Diagnosis not present

## 2018-12-24 DIAGNOSIS — B9681 Helicobacter pylori [H. pylori] as the cause of diseases classified elsewhere: Secondary | ICD-10-CM

## 2018-12-24 NOTE — Progress Notes (Signed)
RFV: follow up for recurrent h.pylori  Patient ID: Arthur Wright, male   DOB: [birth year], 53 y.o.   MRN: [id number]  HPI Arthur Wright is a pleasant 53yo M with chronic hep B but also recurrent h.pylori. he was last seen roughly a month ago for recommendations on salvage regimen of  14d of ppi sd, clarithro 500 bid, amox 1gm bid, metronidazole 500mg  TID  Tolerated his regimen without difficulty, feeling better  Repeated breath test which was negative, last week  Outpatient Encounter Medications as of 12/24/2018  Medication Sig  . entecavir (BARACLUDE) 0.5 MG tablet Take 0.5 mg by mouth daily.  Marland Kitchen amoxicillin (AMOXIL) 500 MG tablet Take 500 mg by mouth 2 (two) times daily.  . clarithromycin (BIAXIN) 500 MG tablet Take 500 mg by mouth 2 (two) times daily.  . metroNIDAZOLE (FLAGYL) 500 MG tablet Take 500 mg by mouth 3 (three) times daily.  Marland Kitchen omeprazole (PRILOSEC) 20 MG capsule Take 1 capsule (20 mg total) by mouth 2 (two) times daily before a meal for 14 days.   No facility-administered encounter medications on file as of 12/24/2018.      Patient Active Problem List   Diagnosis Date Noted  . Gastric ulcer due to Helicobacter pylori 35/00/9381  . Chronic hepatitis B ([place]) 11/22/2014  . Obesity 11/22/2014     Health Maintenance Due  Topic Date Due  . COLONOSCOPY  11/16/2016     Review of Systems Review of Systems  Constitutional: Negative for fever, chills, diaphoresis, activity change, appetite change, fatigue and unexpected weight change.  HENT: Negative for congestion, sore throat, rhinorrhea, sneezing, trouble swallowing and sinus pressure.  Eyes: Negative for photophobia and visual disturbance.  Respiratory: Negative for cough, chest tightness, shortness of breath, wheezing and stridor.  Cardiovascular: Negative for chest pain, palpitations and leg swelling.  Gastrointestinal: Negative for nausea, vomiting, abdominal pain, diarrhea, constipation, blood in stool, abdominal  distention and anal bleeding.  Genitourinary: Negative for dysuria, hematuria, flank pain and difficulty urinating.  Musculoskeletal: Negative for myalgias, back pain, joint swelling, arthralgias and gait problem.  Skin: Negative for color change, pallor, rash and wound.  Neurological: Negative for dizziness, tremors, weakness and light-headedness.  Hematological: Negative for adenopathy. Does not bruise/bleed easily.  Psychiatric/Behavioral: Negative for behavioral problems, confusion, sleep disturbance, dysphoric mood, decreased concentration and agitation.    Physical Exam   BP (!) 130/98   Pulse 73   Temp 98 F (36.7 C) (Oral)   Ht 6\' 2"  (1.88 m)   Wt 296 lb (134.3 kg)   BMI 38.00 kg/m   gen = a xo by 3 in nad HEENT= no signs of scleral icterus CBC Lab Results  Component Value Date   WBC 6.0 11/21/2018   RBC 4.96 11/21/2018   HGB 12.4 (L) 11/21/2018   HCT 39.6 11/21/2018   PLT 121 (L) 11/21/2018   MCV 80 11/21/2018   MCH 25.0 (L) 11/21/2018   MCHC 31.3 (L) 11/21/2018   RDW 13.4 11/21/2018   LYMPHSABS 2.5 11/21/2018   MONOABS 0.5 10/30/2006   EOSABS 0.0 11/21/2018    BMET Lab Results  Component Value Date   NA 135 11/21/2018   K 4.2 11/21/2018   CL 101 11/21/2018   CO2 21 11/21/2018   GLUCOSE 71 11/21/2018   BUN 11 11/21/2018   CREATININE 1.02 11/21/2018   CALCIUM 8.9 11/21/2018   GFRNONAA 84 11/21/2018   GFRAA 97 11/21/2018      Assessment and Plan  Recurrent hpylori treatment = appears  treated since negative breath test.  Chronic hepatitis b = recently had imaging to check for [facility], which showed stable cirrhosis. Consider mri at next visit

## 2018-12-31 ENCOUNTER — Ambulatory Visit (HOSPITAL_COMMUNITY)
Admission: RE | Admit: 2018-12-31 | Discharge: 2018-12-31 | Disposition: A | Discharge status: Home / Self Care | Payer: 59 | Source: Ambulatory Visit | Attending: Gastroenterology | Admitting: Gastroenterology

## 2018-12-31 ENCOUNTER — Other Ambulatory Visit: Payer: Self-pay

## 2018-12-31 DIAGNOSIS — K7460 Unspecified cirrhosis of liver: Secondary | ICD-10-CM | POA: Insufficient documentation

## 2019-01-15 ENCOUNTER — Ambulatory Visit: Payer: 59 | Admitting: Gastroenterology

## 2019-01-29 ENCOUNTER — Ambulatory Visit: Payer: 59 | Admitting: Gastroenterology

## 2019-01-29 ENCOUNTER — Encounter: Payer: Self-pay | Admitting: Gastroenterology

## 2019-01-29 VITALS — BP 130/72 | HR 88 | Temp 97.8°F | Ht 74.0 in | Wt 304.0 lb

## 2019-01-29 DIAGNOSIS — K2990 Gastroduodenitis, unspecified, without bleeding: Secondary | ICD-10-CM

## 2019-01-29 DIAGNOSIS — A048 Other specified bacterial intestinal infections: Secondary | ICD-10-CM | POA: Diagnosis not present

## 2019-01-29 DIAGNOSIS — K2970 Gastritis, unspecified, without bleeding: Secondary | ICD-10-CM | POA: Diagnosis not present

## 2019-01-29 DIAGNOSIS — K7460 Unspecified cirrhosis of liver: Secondary | ICD-10-CM | POA: Diagnosis not present

## 2019-01-29 NOTE — Patient Instructions (Signed)
Thank you for choosing me and [facility].  Dr. Loletha Carrow

## 2019-01-29 NOTE — Progress Notes (Signed)
[facility] Progress Note  Chief Complaint: Ulcerative gastritis and duodenitis from H. pylori  Subjective  History: Arthur Wright follows up for the first time since his endoscopy in November 2019.  He was originally reviewed by the [facility] clinic for cirrhosis from hepatitis B with a request to perform upper endoscopy for variceal screening.  EGD found no varices, but he had diffuse distal gastric and proximal duodenal ulcers with a mild duodenal bulb stricture.  H. pylori was positive, and could not be cleared after 2 rounds of antibiotics.  He was referred to infectious disease, who prescribed a third round of different antibiotics, and fortunately his recent urea breath test (done while off PPI) confirmed eradication.  He denies abdominal pain, nausea vomiting altered bowel habits or blood in the stool.  He has had follow-up with [facility] within the last couple of months by his report.  He also continues entecavir for his chronic hepatitis B infection. He also tells me that primary care recently did a Cologuard test, but he has not yet heard the results.  ROS: Cardiovascular:  no chest pain Respiratory: no dyspnea  The patient's Past Medical, Family and Social History were reviewed and are on file in the EMR.  Objective:  Med list reviewed  Current Outpatient Medications:  .  entecavir (BARACLUDE) 0.5 MG tablet, Take 0.5 mg by mouth daily., Disp: , Rfl:    Vital signs in last 24 hrs: Vitals:   01/29/19 0915  BP: 130/72  Pulse: 88  Temp: 97.8 F (36.6 C)    Physical Exam  Well-appearing  HEENT: sclera anicteric, oral mucosa moist without lesions  Neck: supple, no thyromegaly, JVD or lymphadenopathy  Cardiac: RRR without murmurs, S1S2 heard, no peripheral edema  Pulm: clear to auscultation bilaterally, normal RR and effort noted  Abdomen: soft, no tenderness, with active bowel sounds. No guarding or palpable hepatosplenomegaly, but limited by  body habitus  Skin; warm and dry, no jaundice or rash  Recent Labs:  CBC Latest Ref Rng & Units 11/21/2018 03/29/2016 12/22/2015  WBC 3.4 - 10.8 x10E3/uL 6.0 5.6 5.3  Hemoglobin 13.0 - 17.7 g/dL 12.4(L) 14.0 13.8  Hematocrit 37.5 - 51.0 % 39.6 43.7 43.5  Platelets 150 - 450 x10E3/uL 121(L) 151 145   CMP Latest Ref Rng & Units 11/21/2018 12/22/2015 11/22/2014  Glucose 65 - 99 mg/dL 71 87 79  BUN 6 - 24 mg/dL 11 12 12   Creatinine 0.76 - 1.27 mg/dL 1.02 1.04 1.05  Sodium 134 - 144 mmol/L 135 135 138  Potassium 3.5 - 5.2 mmol/L 4.2 4.3 4.2  Chloride 96 - 106 mmol/L 101 102 101  CO2 20 - 29 mmol/L 21 25 24   Calcium 8.7 - 10.2 mg/dL 8.9 9.0 9.4  Total Protein 6.0 - 8.5 g/dL 9.5(H) 9.2(H) 9.7(H)  Total Bilirubin 0.0 - 1.2 mg/dL <0.2 0.5 0.4  Alkaline Phos 39 - 117 IU/L 112 93 92  AST 0 - 40 IU/L 79(H) 69(H) 49(H)  ALT 0 - 44 IU/L 28 33 20    Recent UBT negative   Radiologic studies:  CLINICAL DATA:  Hepatitis-B   EXAM: ABDOMEN ULTRASOUND COMPLETE   COMPARISON:  03/21/2016   FINDINGS: Gallbladder: No gallstones or wall thickening visualized. No sonographic Murphy sign noted by sonographer.   Common bile duct: Diameter: Normal caliber, 5 mm   Liver: Coarsened echotexture throughout the liver. No focal abnormality or biliary ductal dilatation. Portal vein is patent with hepatopetal flow.   IVC: No  abnormality visualized.   Pancreas: Visualized portion unremarkable.   Spleen: Size and appearance within normal limits.   Right Kidney: Length: 11.4 cm. Echogenicity within normal limits. No mass or hydronephrosis visualized.   Left Kidney: Length: 11.8 cm. Echogenicity within normal limits. No mass or hydronephrosis visualized.   Abdominal aorta: No aneurysm visualized.   Other findings: None.   IMPRESSION: Changes of cirrhosis.  No focal hepatic abnormality.   No acute findings.     Electronically Signed   By: Rolm Baptise M.D.   On: 09/26/2016 09:58    @ASSESSMENTPLANBEGIN @ Assessment: Encounter Diagnoses  Name Primary?  . Cirrhosis of liver without ascites, unspecified hepatic cirrhosis type ([place]) Yes  . Helicobacter pylori infection   . Gastritis and gastroduodenitis     Cirrhosis from chronic hepatitis B infection, on antiviral therapy and followed by hepatology. No esophageal or gastric varices on endoscopy November 2019.  Plan was to repeat EGD at a 3-year interval.  H. pylori related gastric and duodenal ulcers with mild duodenal stricture.  H. pylori now cleared and patient asymptomatic.  Plan: He no longer needs PPI therapy.  He is referred back to hepatology to see me as needed.  I also gave him my card with our contact info for both him and primary care, especially if he requires a colonoscopy for positive Cologuard test.   Total time 15 minutes, over half spent face-to-face with patient in counseling and coordination of care.   Nelida Meuse III

## 2019-02-18 ENCOUNTER — Other Ambulatory Visit: Payer: Self-pay

## 2019-02-18 ENCOUNTER — Ambulatory Visit: Payer: 59 | Admitting: Registered Nurse

## 2019-02-18 ENCOUNTER — Encounter: Payer: Self-pay | Admitting: Registered Nurse

## 2019-02-18 VITALS — BP 131/77 | HR 74 | Temp 98.5°F | Resp 16 | Wt 307.0 lb

## 2019-02-18 DIAGNOSIS — Z23 Encounter for immunization: Secondary | ICD-10-CM

## 2019-02-18 DIAGNOSIS — B181 Chronic viral hepatitis B without delta-agent: Secondary | ICD-10-CM

## 2019-02-18 NOTE — Progress Notes (Signed)
Established Patient Office Visit  Subjective:  Patient ID: Arthur Wright, male    DOB: September 27, 1965  Age: 53 y.o. MRN: [id number]  CC:  Chief Complaint  Patient presents with  . Chronic Conditions    3 month follow-up     HPI Arthur Wright presents for follow up for lab work He reports that he has been feeling well. He has been cleared of his H Pylori infection and dismissed from GI, he will continue to follow up with hepatology for his chronic Hep B and cirrhosis.  No major concerns today. Requests flu shot.   Past Medical History:  Diagnosis Date  . Cirrhosis ([place])   . H. pylori infection   . Hepatitis B   . Liver fibrosis   . Thrombocythemia [facility])     Past Surgical History:  Procedure Laterality Date  . NO PAST SURGERIES      Family History  Problem Relation Age of Onset  . Hepatitis B Father   . Hypertension Father   . Hepatitis B Brother   . Liver cancer Brother   . Colon cancer Neg Hx   . Colon polyps Neg Hx   . Esophageal cancer Neg Hx   . Rectal cancer Neg Hx   . Stomach cancer Neg Hx     Social History   Socioeconomic History  . Marital status: Married    Spouse name: Not on file  . Number of children: 4  . Years of education: Not on file  . Highest education level: Not on file  Occupational History  . Occupation: Proofreader    Comment: works nights  Social Needs  . Financial resource strain: Not hard at all  . Food insecurity    Worry: Never true    Inability: Never true  . Transportation needs    Medical: No    Non-medical: No  Tobacco Use  . Smoking status: Never Smoker  . Smokeless tobacco: Never Used  Substance and Sexual Activity  . Alcohol use: Not Currently    Alcohol/week: 1.0 standard drinks    Types: 1 Standard drinks or equivalent per week    Comment: stopped due to cirrhosis  . Drug use: No  . Sexual activity: Not on file  Lifestyle  . Physical activity    Days per week: 5 days    Minutes per session: 60 min  .  Stress: Only a little  Relationships  . Social Herbalist on phone: Three times a week    Gets together: Twice a week    Attends religious service: More than 4 times per year    Active member of club or organization: No    Attends meetings of clubs or organizations: Never    Relationship status: Married  . Intimate partner violence    Fear of current or ex partner: No    Emotionally abused: No    Physically abused: No    Forced sexual activity: No  Other Topics Concern  . Not on file  Social History Narrative  . Not on file    Outpatient Medications Prior to Visit  Medication Sig Dispense Refill  . entecavir (BARACLUDE) 0.5 MG tablet Take 0.5 mg by mouth daily.     No facility-administered medications prior to visit.     No Known Allergies  ROS Review of Systems  Constitutional: Negative.   HENT: Negative.   Eyes: Negative.   Respiratory: Negative.  Negative for shortness of breath.   Cardiovascular: Negative.  Negative for palpitations.  Gastrointestinal: Negative.  Negative for abdominal pain, anal bleeding and blood in stool.  Endocrine: Negative.   Genitourinary: Negative.   Musculoskeletal: Negative.   Skin: Negative.   Allergic/Immunologic: Negative.   Neurological: Negative.   Hematological: Negative.   Psychiatric/Behavioral: Negative.   All other systems reviewed and are negative.     Objective:    Physical Exam  Constitutional: He is oriented to person, place, and time. He appears well-developed and well-nourished. No distress.  Cardiovascular: Normal rate and regular rhythm.  Pulmonary/Chest: Effort normal. No respiratory distress.  Neurological: He is alert and oriented to person, place, and time.  Skin: He is not diaphoretic.  Psychiatric: He has a normal mood and affect. His behavior is normal. Judgment and thought content normal.  Nursing note and vitals reviewed.   BP 131/77   Pulse 74   Temp 98.5 F (36.9 C) (Oral)   Resp 16    Wt (!) 307 lb (139.3 kg)   SpO2 97%   BMI 39.42 kg/m  Wt Readings from Last 3 Encounters:  02/18/19 (!) 307 lb (139.3 kg)  01/29/19 (!) 304 lb (137.9 kg)  12/24/18 296 lb (134.3 kg)     Health Maintenance Due  Topic Date Due  . COLONOSCOPY  11/16/2016    There are no preventive care reminders to display for this patient.  Lab Results  Component Value Date   TSH 1.810 11/21/2018   Lab Results  Component Value Date   WBC 6.0 11/21/2018   HGB 12.4 (L) 11/21/2018   HCT 39.6 11/21/2018   MCV 80 11/21/2018   PLT 121 (L) 11/21/2018   Lab Results  Component Value Date   NA 135 11/21/2018   K 4.2 11/21/2018   CO2 21 11/21/2018   GLUCOSE 71 11/21/2018   BUN 11 11/21/2018   CREATININE 1.02 11/21/2018   BILITOT <0.2 11/21/2018   ALKPHOS 112 11/21/2018   AST 79 (H) 11/21/2018   ALT 28 11/21/2018   PROT 9.5 (H) 11/21/2018   ALBUMIN 3.4 (L) 11/21/2018   CALCIUM 8.9 11/21/2018   Lab Results  Component Value Date   CHOL 113 11/21/2018   Lab Results  Component Value Date   HDL 46 11/21/2018   Lab Results  Component Value Date   LDLCALC 47 11/21/2018   Lab Results  Component Value Date   TRIG 102 11/21/2018   Lab Results  Component Value Date   CHOLHDL 2.5 11/21/2018   Lab Results  Component Value Date   HGBA1C 5.2 11/21/2018      Assessment & Plan:   Problem List Items Addressed This Visit      Digestive   Chronic hepatitis B ([place])   Relevant Orders   CBC with Differential/Platelet   Comprehensive metabolic panel    Other Visit Diagnoses    Flu vaccine need    -  Primary   Relevant Orders   Flu Vaccine QUAD 36+ mos IM (Completed)      No orders of the defined types were placed in this encounter.   Follow-up: No follow-ups on file.   PLAN  Recheck labs  Pt states that he has completed cologuard - will follow for results  Continue following with hepatology  If labs are not of concern, follow up in 1 year for CPE and labs  Patient  encouraged to call clinic with any questions, comments, or concerns.   Maximiano Coss, NP

## 2019-02-18 NOTE — Patient Instructions (Signed)
° ° ° °  If you have lab work done today you will be contacted with your lab results within the next 2 weeks.  If you have not heard from us then please contact us. The fastest way to get your results is to register for My Chart. ° ° °IF you received an x-ray today, you will receive an invoice from [facility]. Please contact [facility] at [phone] with questions or concerns regarding your invoice.  ° °IF you received labwork today, you will receive an invoice from [facility]. Please contact [facility] at [phone] with questions or concerns regarding your invoice.  ° °Our billing staff will not be able to assist you with questions regarding bills from these companies. ° °You will be contacted with the lab results as soon as they are available. The fastest way to get your results is to activate your My Chart account. Instructions are located on the last page of this paperwork. If you have not heard from us regarding the results in 2 weeks, please contact this office. °  ° ° ° °

## 2019-02-19 ENCOUNTER — Encounter: Payer: Self-pay | Admitting: Registered Nurse

## 2019-02-19 LAB — CBC WITH DIFFERENTIAL/PLATELET
Basophils Absolute: 0 10*3/uL (ref 0.0–0.2)
Basos: 1 %
EOS (ABSOLUTE): 0.1 10*3/uL (ref 0.0–0.4)
Eos: 1 %
Hematocrit: 40.4 % (ref 37.5–51.0)
Hemoglobin: 12.7 g/dL — ABNORMAL LOW (ref 13.0–17.7)
Immature Grans (Abs): 0 10*3/uL (ref 0.0–0.1)
Immature Granulocytes: 0 %
Lymphocytes Absolute: 2.5 10*3/uL (ref 0.7–3.1)
Lymphs: 43 %
MCH: 25 pg — ABNORMAL LOW (ref 26.6–33.0)
MCHC: 31.4 g/dL — ABNORMAL LOW (ref 31.5–35.7)
MCV: 79 fL (ref 79–97)
Monocytes Absolute: 0.4 10*3/uL (ref 0.1–0.9)
Monocytes: 6 %
Neutrophils Absolute: 2.9 10*3/uL (ref 1.4–7.0)
Neutrophils: 49 %
Platelets: 117 10*3/uL — ABNORMAL LOW (ref 150–450)
RBC: 5.09 x10E6/uL (ref 4.14–5.80)
RDW: 12.8 % (ref 11.6–15.4)
WBC: 5.9 10*3/uL (ref 3.4–10.8)

## 2019-02-19 LAB — COMPREHENSIVE METABOLIC PANEL
ALT: 22 IU/L (ref 0–44)
AST: 61 IU/L — ABNORMAL HIGH (ref 0–40)
Albumin/Globulin Ratio: 0.7 — ABNORMAL LOW (ref 1.2–2.2)
Albumin: 3.8 g/dL (ref 3.8–4.9)
Alkaline Phosphatase: 129 IU/L — ABNORMAL HIGH (ref 39–117)
BUN/Creatinine Ratio: 11 (ref 9–20)
BUN: 11 mg/dL (ref 6–24)
Bilirubin Total: 0.5 mg/dL (ref 0.0–1.2)
CO2: 25 mmol/L (ref 20–29)
Calcium: 9.1 mg/dL (ref 8.7–10.2)
Chloride: 98 mmol/L (ref 96–106)
Creatinine, Ser: 1.01 mg/dL (ref 0.76–1.27)
GFR calc Af Amer: 98 mL/min/{1.73_m2} (ref >59)
GFR calc non Af Amer: 85 mL/min/{1.73_m2} (ref >59)
Globulin, Total: 5.2 g/dL — ABNORMAL HIGH (ref 1.5–4.5)
Glucose: 151 mg/dL — ABNORMAL HIGH (ref 65–99)
Potassium: 4.6 mmol/L (ref 3.5–5.2)
Sodium: 136 mmol/L (ref 134–144)
Total Protein: 9 g/dL — ABNORMAL HIGH (ref 6.0–8.5)

## 2019-02-19 NOTE — Progress Notes (Signed)
Results letter sent to patient Labs not concerning Abnormalities steady, pt with chronic Hep B and cirrhosis Recently cleared of H pylori infection, no signs of ulcer/GI bleed on CBC  Kathrin Ruddy, NP

## 2019-03-12 IMAGING — US US ABDOMEN LIMITED
1 series · 14 of 25 positions shown · non-contrast
Comparison: 09/25/2017

CLINICAL DATA: Hepatitis-B.  Cirrhosis

EXAM:
ULTRASOUND ABDOMEN LIMITED RIGHT UPPER QUADRANT

[Series 1: us abdomen limited · 0.25mm/px · 14 of 37 slices shown]
[im 1/37]
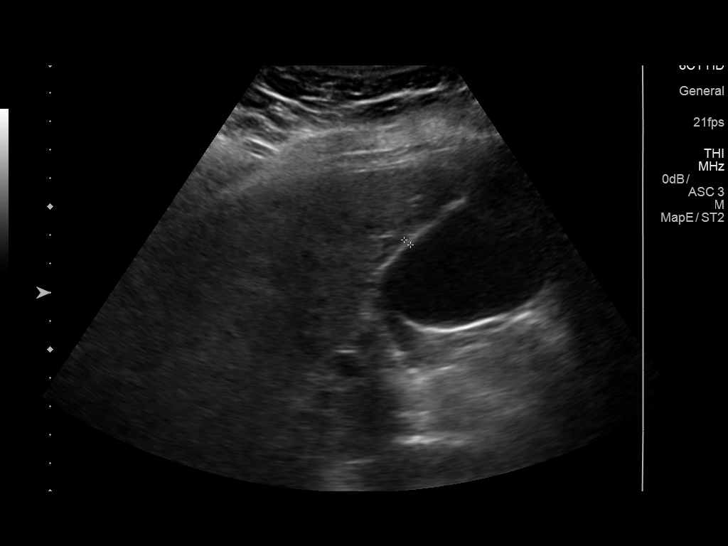
[im 4/37]
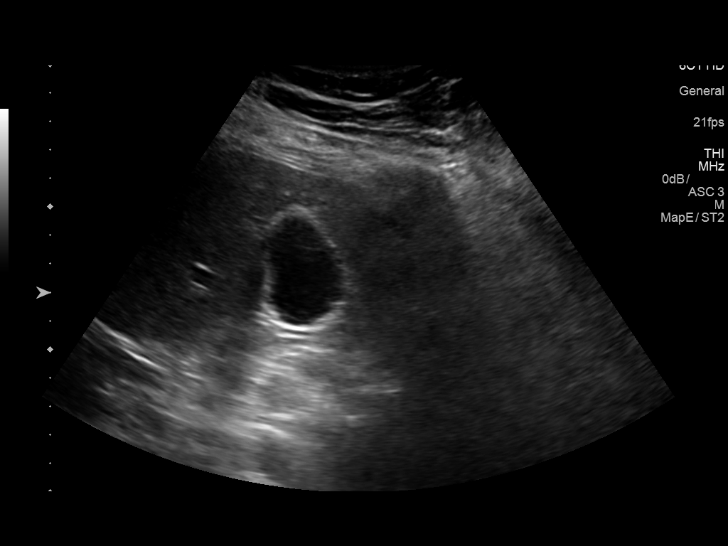
[im 7/37]
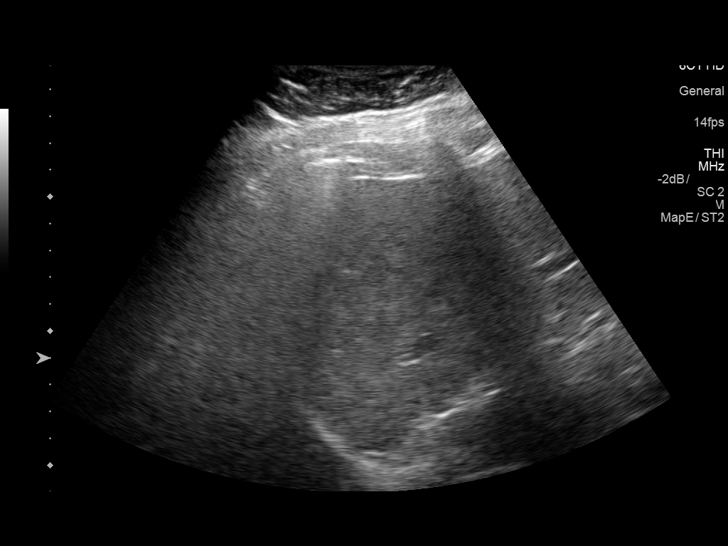
[im 10/37]
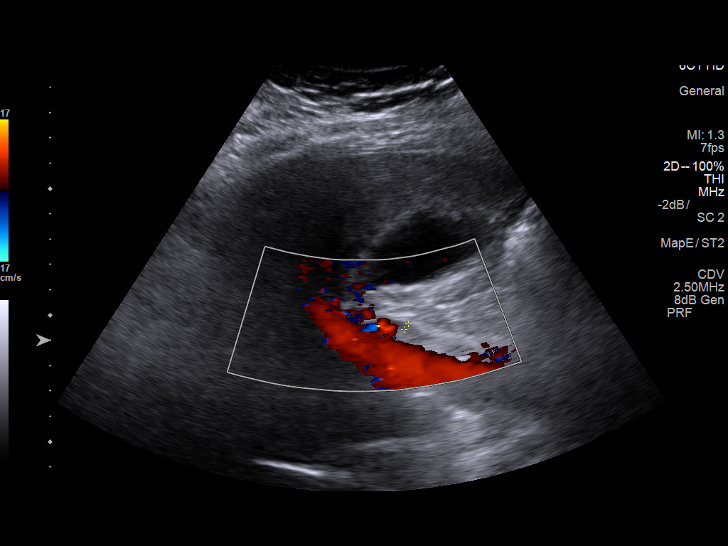
[im 13/37]
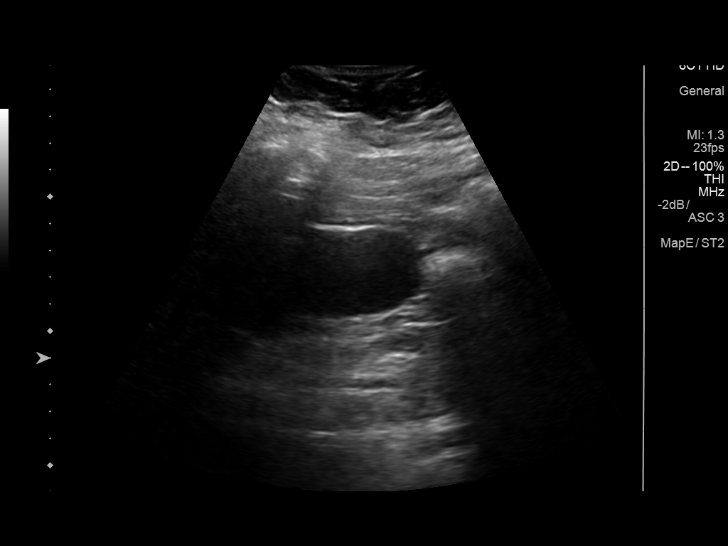
[im 14/37]
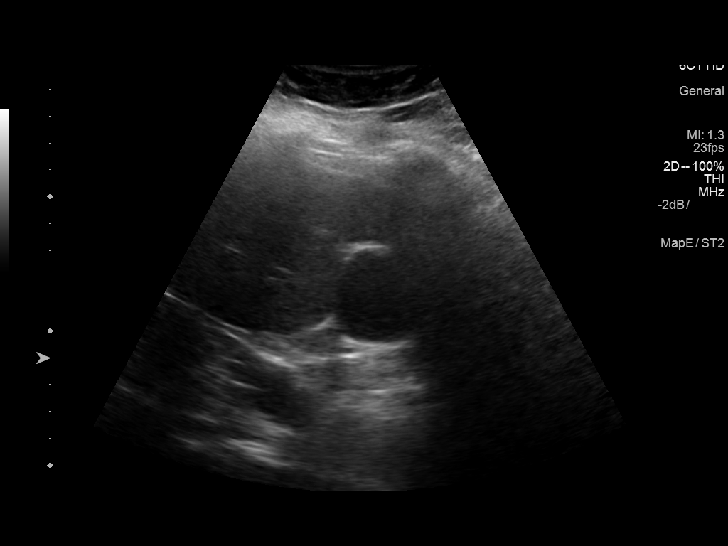
[im 17/37]
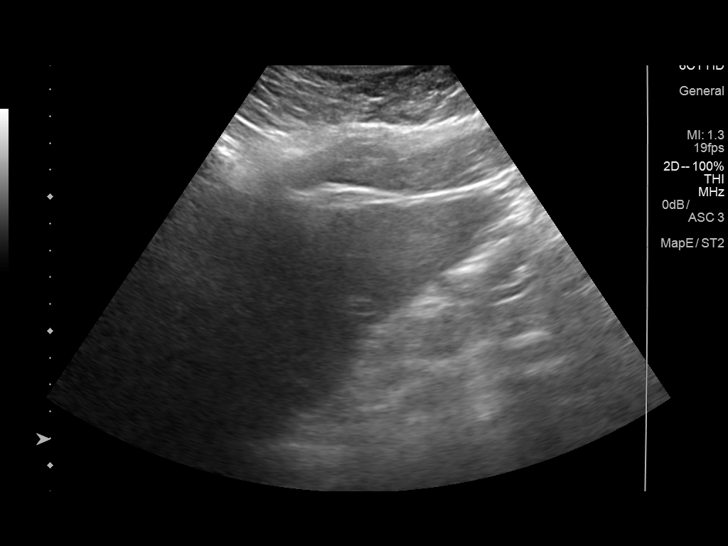
[im 20/37]
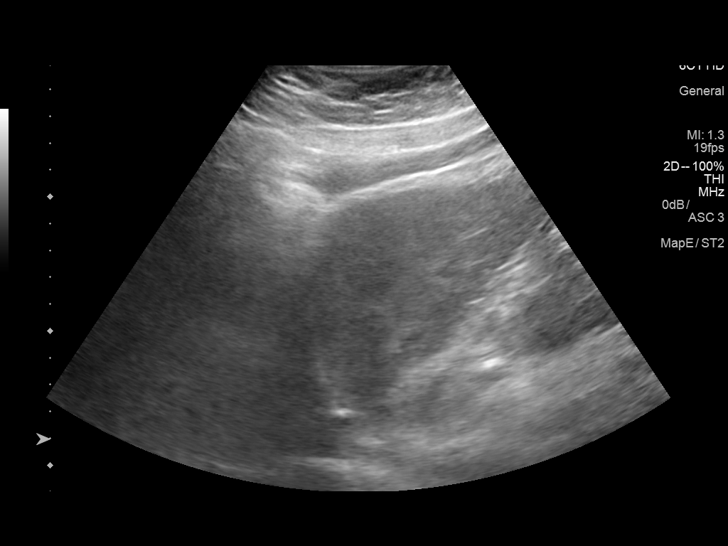
[im 23/37]
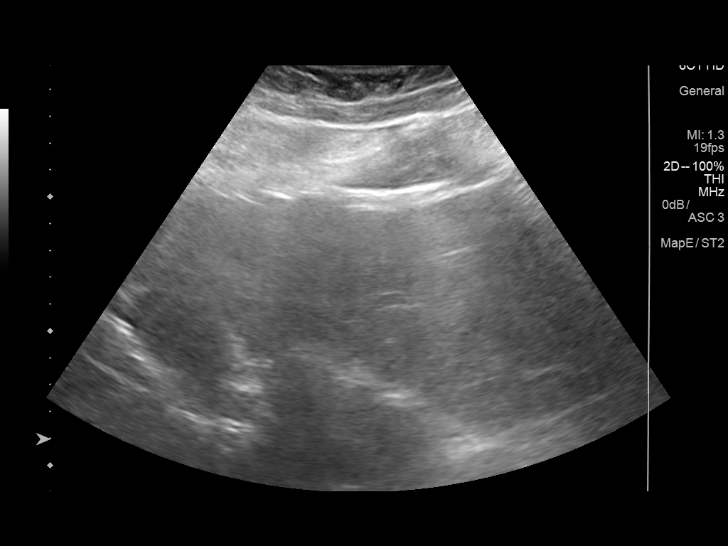
[im 25/37]
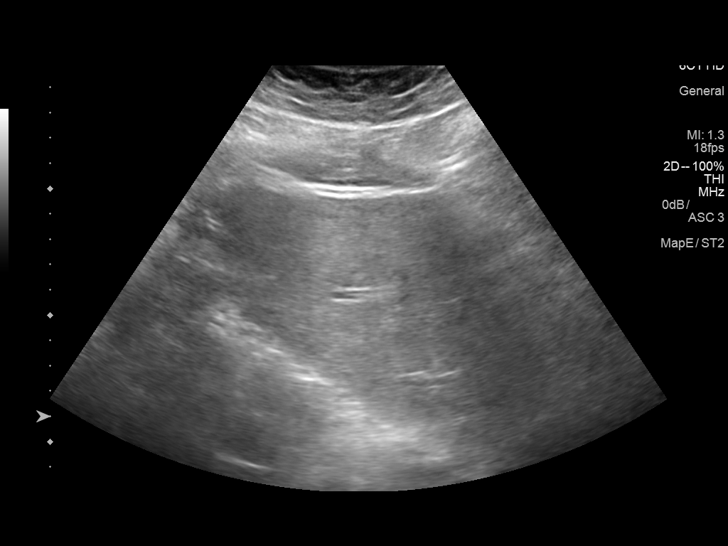
[im 28/37]
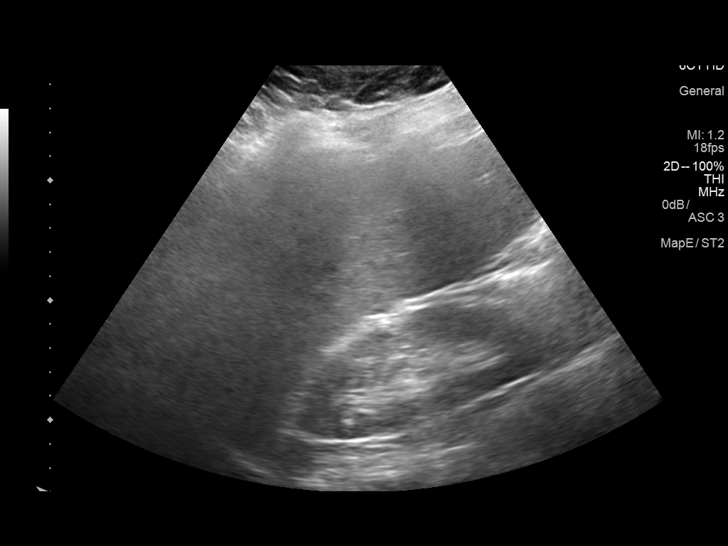
[im 31/37]
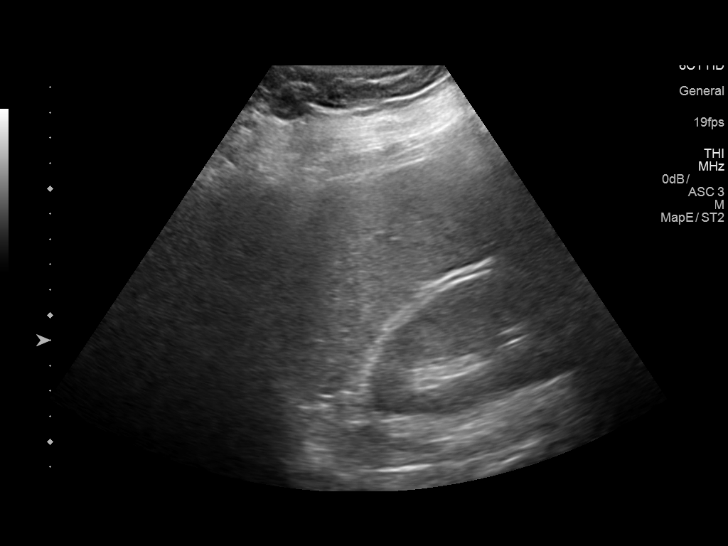
[im 34/37]
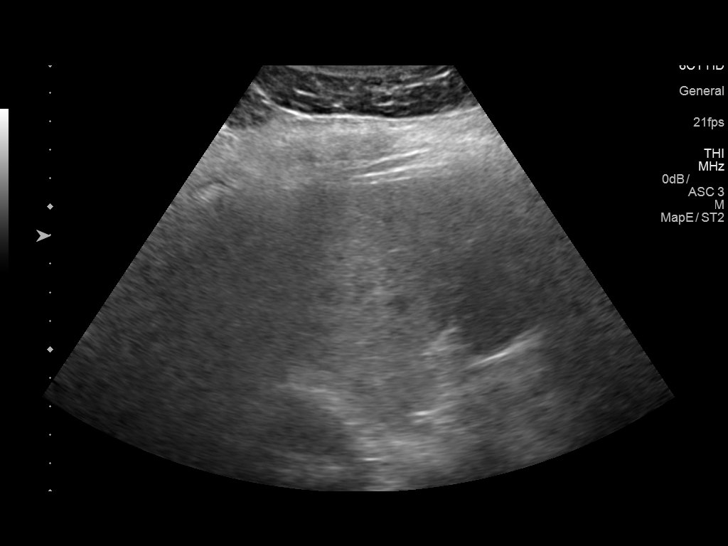
[im 37/37]
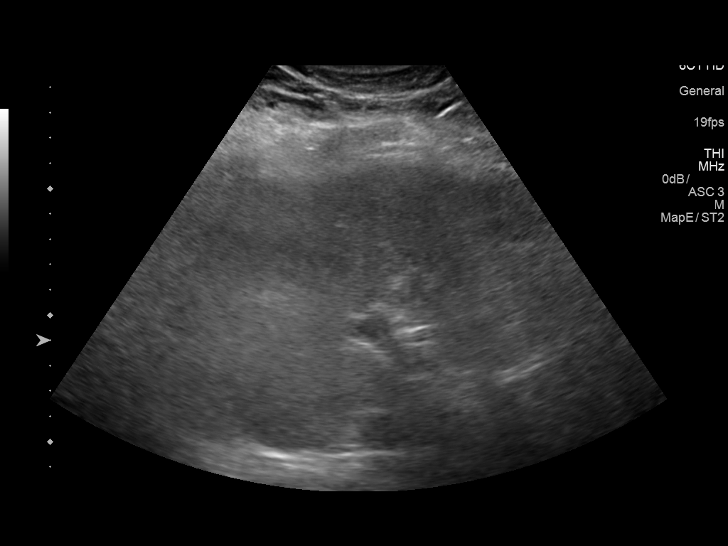

[14 of 25 positions shown; findings below may reference images not displayed]

FINDINGS: Gallbladder:

No gallstones or wall thickening visualized. No sonographic Murphy
sign noted by sonographer.

Common bile duct:

Diameter: Normal caliber, 3 mm

Liver:

Coarsened, increased echotexture with nodular contours compatible
with cirrhosis. No focal hepatic abnormality or biliary ductal
dilatation. Portal vein is patent on color Doppler imaging with
normal direction of blood flow towards the liver.
IMPRESSION: Changes of cirrhosis.  No focal hepatic abnormality.

## 2019-05-20 ENCOUNTER — Other Ambulatory Visit: Payer: Self-pay | Admitting: Nurse Practitioner

## 2019-05-20 DIAGNOSIS — K7469 Other cirrhosis of liver: Secondary | ICD-10-CM

## 2019-05-29 ENCOUNTER — Ambulatory Visit
Admission: RE | Admit: 2019-05-29 | Discharge: 2019-05-29 | Disposition: A | Discharge status: Home / Self Care | Payer: 59 | Source: Ambulatory Visit | Attending: Nurse Practitioner | Admitting: Nurse Practitioner

## 2019-05-29 DIAGNOSIS — K7469 Other cirrhosis of liver: Secondary | ICD-10-CM

## 2020-01-01 IMAGING — US ULTRASOUND ABDOMEN LIMITED
1 series · 14 of 25 positions shown · non-contrast
Comparison: Right upper quadrant ultrasound dated March 13, 2018.

CLINICAL DATA: Cirrhosis.

EXAM:
ULTRASOUND ABDOMEN LIMITED RIGHT UPPER QUADRANT

[Series 1: ultrasound abdomen limited · 0.25mm/px · 14 of 43 slices shown]
[im 1/43]
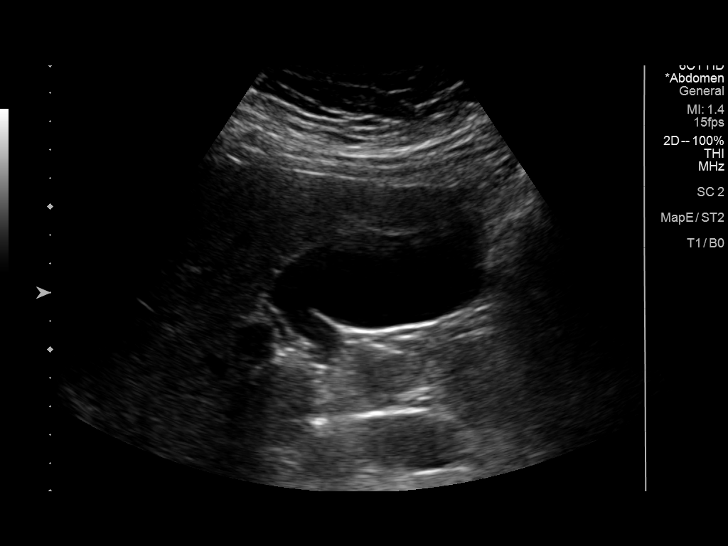
[im 4/43]
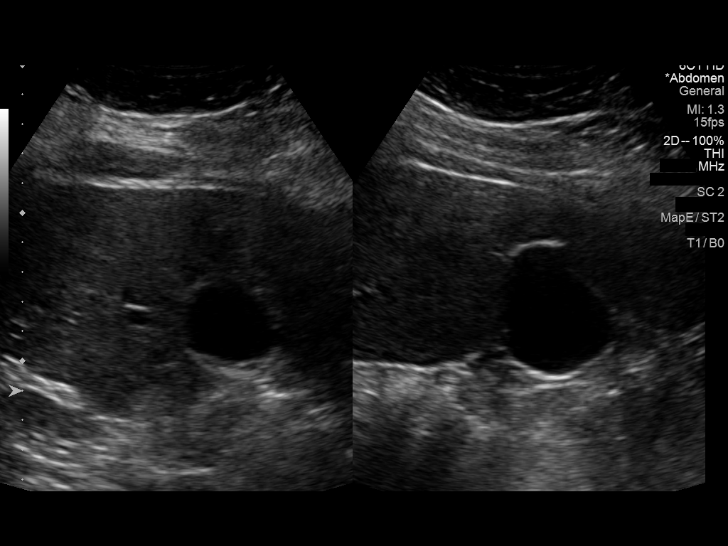
[im 8/43]
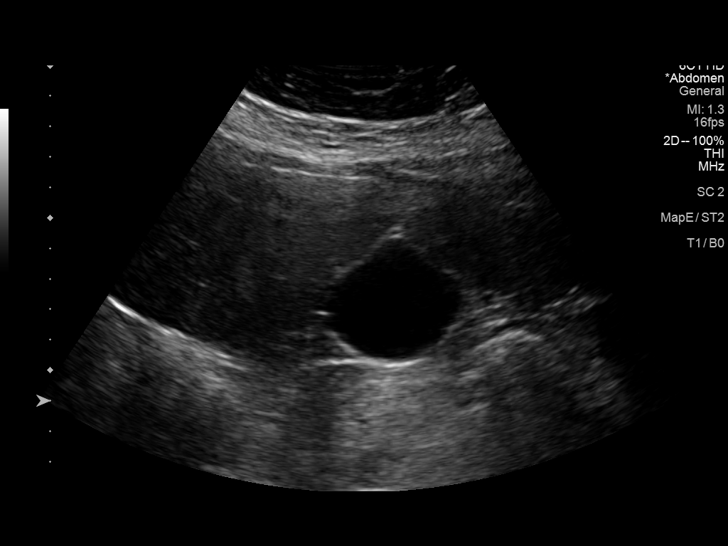
[im 11/43]
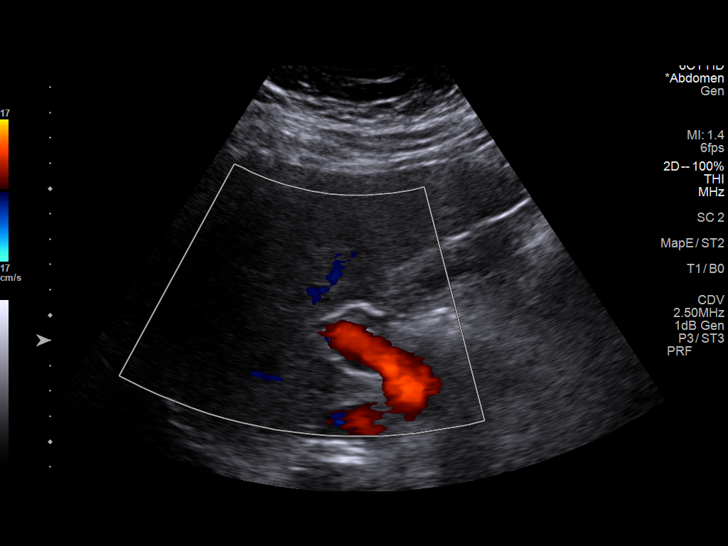
[im 15/43]
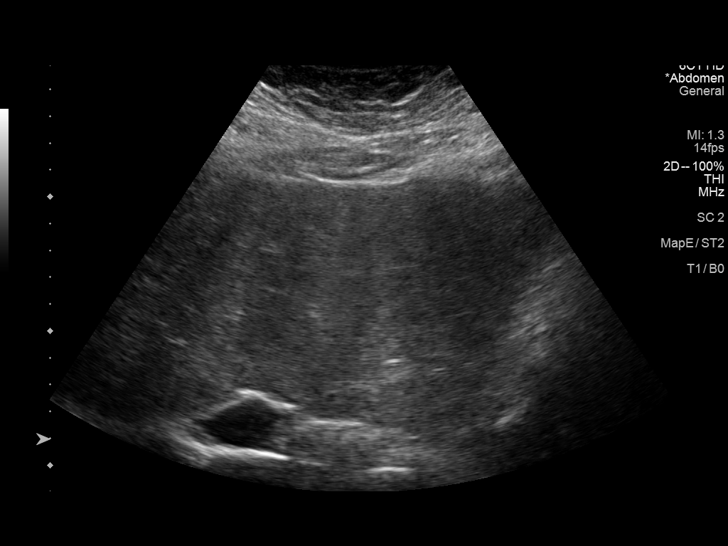
[im 16/43]
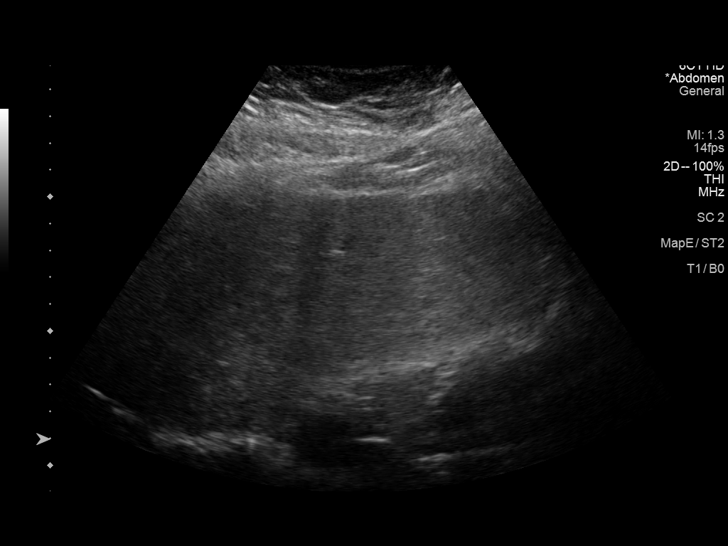
[im 20/43]
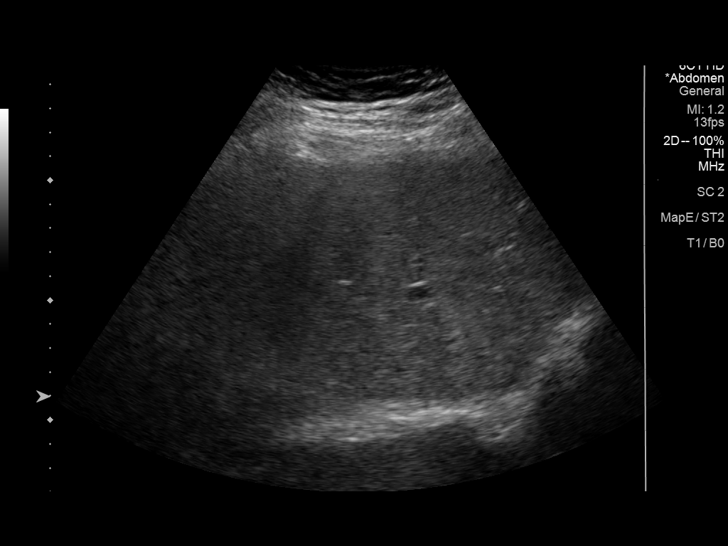
[im 23/43]
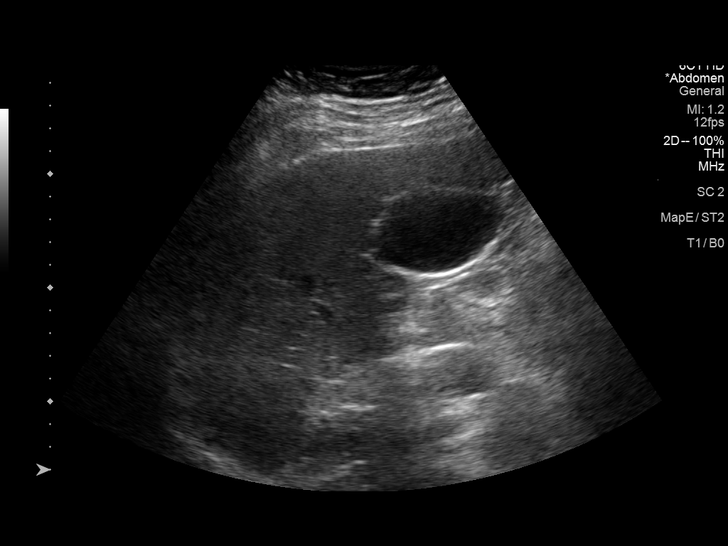
[im 27/43]
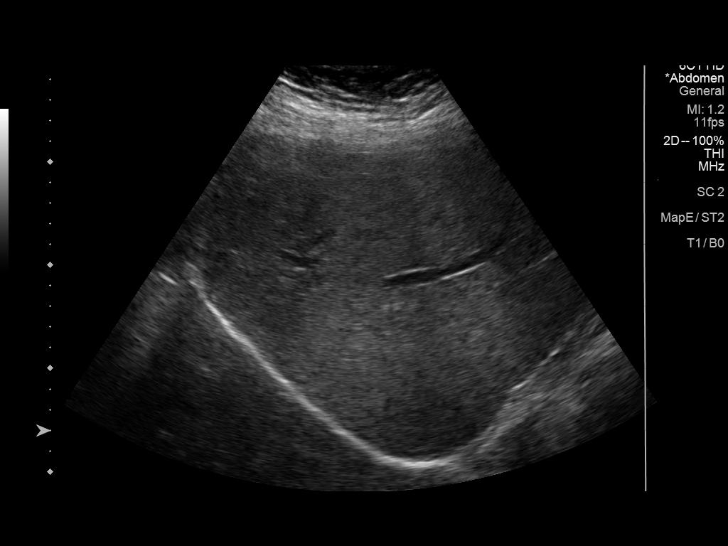
[im 29/43]
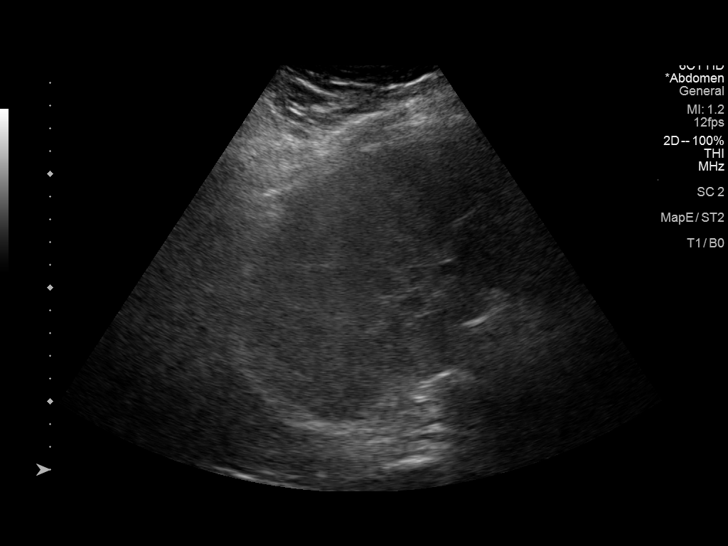
[im 32/43]
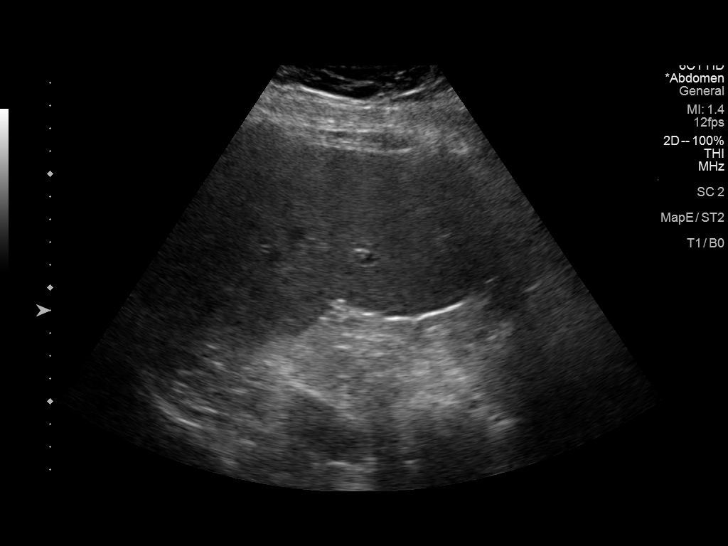
[im 36/43]
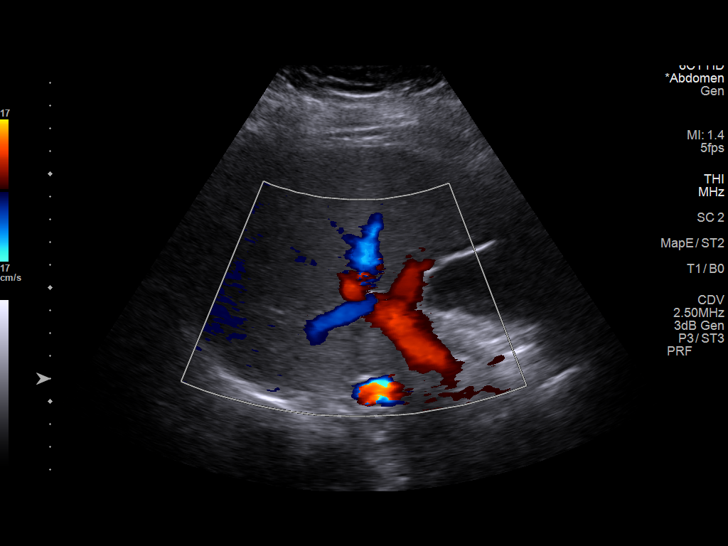
[im 39/43]
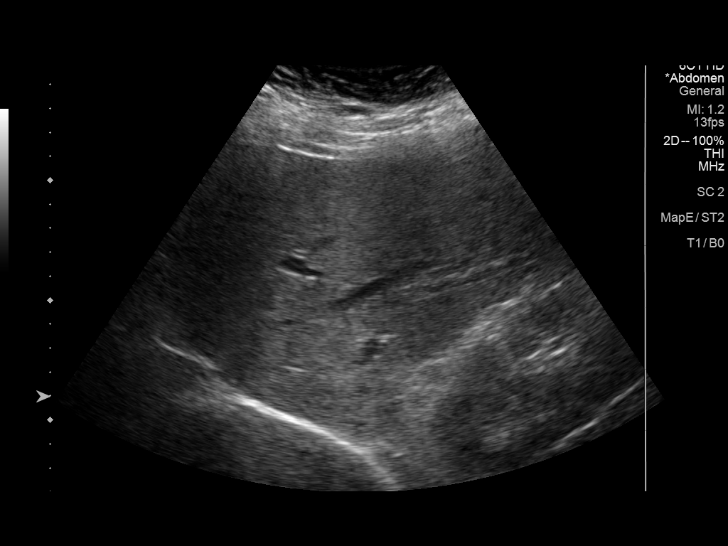
[im 43/43]
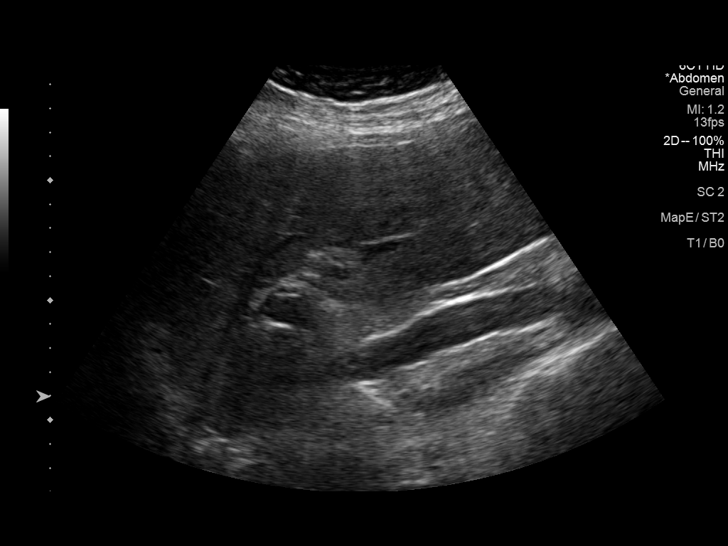

[14 of 25 positions shown; findings below may reference images not displayed]

FINDINGS: Gallbladder:

No gallstones or wall thickening visualized. No sonographic Murphy
sign noted by sonographer.

Common bile duct:

Diameter: 5 mm, normal.

Liver:

No focal lesion identified. Unchanged nodular contour with
coarsened, heterogeneous parenchymal echogenicity. Portal vein is
patent on color Doppler imaging with normal direction of blood flow
towards the liver.
IMPRESSION: 1. Unchanged cirrhosis.  No focal abnormality.

## 2021-09-10 ENCOUNTER — Encounter: Payer: Self-pay | Admitting: Gastroenterology
